# Patient Record
Sex: Female | Born: 1935 | Race: Black or African American | Hispanic: No
Health system: Southern US, Community
[De-identification: ages and names within clinical notes are randomized; demographics above are authoritative.]

---

## 2004-09-10 ENCOUNTER — Inpatient Hospital Stay: Payer: Self-pay | Admitting: Unknown Physician Specialty

## 2005-12-06 ENCOUNTER — Ambulatory Visit: Payer: Self-pay | Admitting: Unknown Physician Specialty

## 2006-02-24 ENCOUNTER — Inpatient Hospital Stay: Payer: Self-pay | Admitting: Unknown Physician Specialty

## 2006-07-04 ENCOUNTER — Inpatient Hospital Stay: Payer: Self-pay

## 2006-07-04 ENCOUNTER — Other Ambulatory Visit: Payer: Self-pay

## 2006-12-20 ENCOUNTER — Other Ambulatory Visit: Payer: Self-pay

## 2006-12-20 ENCOUNTER — Inpatient Hospital Stay: Payer: Self-pay | Admitting: Internal Medicine

## 2007-01-11 ENCOUNTER — Ambulatory Visit: Payer: Self-pay | Admitting: Oncology

## 2007-01-27 ENCOUNTER — Ambulatory Visit: Payer: Self-pay | Admitting: Oncology

## 2007-02-10 ENCOUNTER — Ambulatory Visit: Payer: Self-pay | Admitting: Oncology

## 2007-02-12 ENCOUNTER — Ambulatory Visit: Payer: Self-pay | Admitting: Oncology

## 2007-03-04 ENCOUNTER — Ambulatory Visit: Payer: Self-pay | Admitting: Unknown Physician Specialty

## 2007-03-05 ENCOUNTER — Other Ambulatory Visit: Payer: Self-pay

## 2007-03-05 ENCOUNTER — Inpatient Hospital Stay: Payer: Self-pay | Admitting: *Deleted

## 2007-03-13 ENCOUNTER — Ambulatory Visit: Payer: Self-pay | Admitting: Oncology

## 2007-04-12 ENCOUNTER — Ambulatory Visit: Payer: Self-pay | Admitting: Oncology

## 2007-04-12 ENCOUNTER — Other Ambulatory Visit: Payer: Self-pay

## 2007-04-12 ENCOUNTER — Emergency Department: Payer: Self-pay | Admitting: Emergency Medicine

## 2007-05-13 ENCOUNTER — Ambulatory Visit: Payer: Self-pay | Admitting: Oncology

## 2007-06-10 ENCOUNTER — Ambulatory Visit: Payer: Self-pay | Admitting: Oncology

## 2007-06-13 ENCOUNTER — Ambulatory Visit: Payer: Self-pay | Admitting: Oncology

## 2007-06-25 ENCOUNTER — Emergency Department: Payer: Self-pay | Admitting: Emergency Medicine

## 2007-07-11 ENCOUNTER — Ambulatory Visit: Payer: Self-pay | Admitting: Oncology

## 2007-08-11 ENCOUNTER — Ambulatory Visit: Payer: Self-pay | Admitting: Oncology

## 2007-08-12 ENCOUNTER — Ambulatory Visit: Payer: Self-pay | Admitting: Oncology

## 2007-09-10 ENCOUNTER — Ambulatory Visit: Payer: Self-pay | Admitting: Oncology

## 2007-09-11 ENCOUNTER — Other Ambulatory Visit: Payer: Self-pay

## 2007-09-12 ENCOUNTER — Inpatient Hospital Stay: Payer: Self-pay | Admitting: Internal Medicine

## 2007-09-14 ENCOUNTER — Ambulatory Visit: Payer: Self-pay | Admitting: Oncology

## 2007-10-11 ENCOUNTER — Ambulatory Visit: Payer: Self-pay | Admitting: Oncology

## 2008-05-12 ENCOUNTER — Ambulatory Visit: Payer: Self-pay | Admitting: Oncology

## 2008-05-17 ENCOUNTER — Ambulatory Visit: Payer: Self-pay | Admitting: Oncology

## 2008-06-12 ENCOUNTER — Ambulatory Visit: Payer: Self-pay | Admitting: Oncology

## 2008-07-10 ENCOUNTER — Ambulatory Visit: Payer: Self-pay | Admitting: Oncology

## 2008-07-13 ENCOUNTER — Ambulatory Visit: Payer: Self-pay | Admitting: Oncology

## 2008-08-10 ENCOUNTER — Ambulatory Visit: Payer: Self-pay | Admitting: Oncology

## 2008-09-09 ENCOUNTER — Ambulatory Visit: Payer: Self-pay | Admitting: Oncology

## 2008-11-09 ENCOUNTER — Ambulatory Visit: Payer: Self-pay | Admitting: Oncology

## 2008-11-28 ENCOUNTER — Ambulatory Visit: Payer: Self-pay | Admitting: Oncology

## 2008-12-10 ENCOUNTER — Ambulatory Visit: Payer: Self-pay | Admitting: Oncology

## 2009-01-10 ENCOUNTER — Ambulatory Visit: Payer: Self-pay | Admitting: Oncology

## 2009-01-11 ENCOUNTER — Ambulatory Visit: Payer: Self-pay | Admitting: Oncology

## 2009-01-31 ENCOUNTER — Emergency Department: Payer: Self-pay | Admitting: Emergency Medicine

## 2009-02-09 ENCOUNTER — Ambulatory Visit: Payer: Self-pay | Admitting: Oncology

## 2009-03-12 ENCOUNTER — Ambulatory Visit: Payer: Self-pay | Admitting: Oncology

## 2009-03-24 ENCOUNTER — Emergency Department: Payer: Self-pay | Admitting: Internal Medicine

## 2009-05-12 ENCOUNTER — Ambulatory Visit: Payer: Self-pay | Admitting: Oncology

## 2009-06-06 ENCOUNTER — Ambulatory Visit: Payer: Self-pay | Admitting: Oncology

## 2009-06-12 ENCOUNTER — Ambulatory Visit: Payer: Self-pay | Admitting: Oncology

## 2009-06-13 ENCOUNTER — Ambulatory Visit: Payer: Self-pay | Admitting: Oncology

## 2009-07-06 ENCOUNTER — Emergency Department: Payer: Self-pay | Admitting: Emergency Medicine

## 2009-07-10 ENCOUNTER — Ambulatory Visit: Payer: Self-pay | Admitting: Oncology

## 2009-08-10 ENCOUNTER — Ambulatory Visit: Payer: Self-pay | Admitting: Oncology

## 2009-09-09 ENCOUNTER — Ambulatory Visit: Payer: Self-pay | Admitting: Oncology

## 2009-10-10 ENCOUNTER — Ambulatory Visit: Payer: Self-pay | Admitting: Oncology

## 2009-11-09 ENCOUNTER — Ambulatory Visit: Payer: Self-pay | Admitting: Oncology

## 2009-12-10 ENCOUNTER — Ambulatory Visit: Payer: Self-pay | Admitting: Oncology

## 2010-02-09 ENCOUNTER — Ambulatory Visit: Payer: Self-pay | Admitting: Oncology

## 2010-02-11 ENCOUNTER — Ambulatory Visit: Payer: Self-pay | Admitting: Oncology

## 2010-03-12 ENCOUNTER — Ambulatory Visit: Payer: Self-pay | Admitting: Oncology

## 2010-04-11 ENCOUNTER — Ambulatory Visit: Payer: Self-pay | Admitting: Oncology

## 2010-05-12 ENCOUNTER — Ambulatory Visit: Payer: Self-pay | Admitting: Oncology

## 2010-05-28 ENCOUNTER — Ambulatory Visit: Payer: Self-pay | Admitting: Oncology

## 2010-06-12 ENCOUNTER — Ambulatory Visit: Payer: Self-pay | Admitting: Oncology

## 2010-08-11 ENCOUNTER — Ambulatory Visit: Payer: Self-pay | Admitting: Oncology

## 2010-08-27 ENCOUNTER — Ambulatory Visit: Payer: Self-pay | Admitting: Oncology

## 2010-09-10 ENCOUNTER — Ambulatory Visit: Payer: Self-pay | Admitting: Oncology

## 2010-09-17 ENCOUNTER — Ambulatory Visit: Payer: Self-pay | Admitting: Oncology

## 2010-10-11 ENCOUNTER — Ambulatory Visit: Payer: Self-pay | Admitting: Oncology

## 2010-11-10 ENCOUNTER — Ambulatory Visit: Payer: Self-pay | Admitting: Oncology

## 2010-11-19 ENCOUNTER — Ambulatory Visit: Payer: Self-pay | Admitting: Oncology

## 2010-11-29 ENCOUNTER — Inpatient Hospital Stay: Payer: Self-pay | Admitting: Orthopedic Surgery

## 2010-12-11 ENCOUNTER — Ambulatory Visit: Payer: Self-pay | Admitting: Oncology

## 2010-12-26 ENCOUNTER — Emergency Department: Payer: Self-pay | Admitting: Emergency Medicine

## 2011-01-11 ENCOUNTER — Ambulatory Visit: Payer: Self-pay | Admitting: Oncology

## 2011-02-10 ENCOUNTER — Ambulatory Visit: Payer: Self-pay | Admitting: Oncology

## 2011-02-14 ENCOUNTER — Emergency Department: Payer: Self-pay | Admitting: Internal Medicine

## 2011-02-24 ENCOUNTER — Ambulatory Visit: Payer: Self-pay | Admitting: Oncology

## 2011-03-13 ENCOUNTER — Ambulatory Visit: Payer: Self-pay | Admitting: Oncology

## 2011-04-12 ENCOUNTER — Ambulatory Visit: Payer: Self-pay | Admitting: Oncology

## 2011-05-13 ENCOUNTER — Ambulatory Visit: Payer: Self-pay | Admitting: Oncology

## 2011-05-31 ENCOUNTER — Inpatient Hospital Stay: Payer: Self-pay | Admitting: Internal Medicine

## 2011-05-31 LAB — COMPREHENSIVE METABOLIC PANEL
Albumin: 2.2 g/dL — ABNORMAL LOW (ref 3.4–5.0)
Alkaline Phosphatase: 84 U/L (ref 50–136)
Anion Gap: 8 (ref 7–16)
BUN: 15 mg/dL (ref 7–18)
Bilirubin,Total: 0.2 mg/dL (ref 0.2–1.0)
Calcium, Total: 8.5 mg/dL (ref 8.5–10.1)
Creatinine: 0.61 mg/dL (ref 0.60–1.30)
Glucose: 107 mg/dL — ABNORMAL HIGH (ref 65–99)
Osmolality: 290 (ref 275–301)
Potassium: 4.1 mmol/L (ref 3.5–5.1)
SGPT (ALT): 20 U/L
Sodium: 145 mmol/L (ref 136–145)
Total Protein: 6.4 g/dL (ref 6.4–8.2)

## 2011-05-31 LAB — CBC
MCH: 29.3 pg (ref 26.0–34.0)
MCHC: 32.1 g/dL (ref 32.0–36.0)
RDW: 17 % — ABNORMAL HIGH (ref 11.5–14.5)

## 2011-05-31 LAB — PROTIME-INR
INR: 1
Prothrombin Time: 14 secs (ref 11.5–14.7)

## 2011-05-31 LAB — TROPONIN I: Troponin-I: <0.02 ng/mL

## 2011-06-01 LAB — CBC WITH DIFFERENTIAL/PLATELET
Basophil %: 0.1 %
Eosinophil #: 0.1 10*3/uL (ref 0.0–0.7)
Eosinophil %: 1 %
HGB: 8.5 g/dL — ABNORMAL LOW (ref 12.0–16.0)
Lymphocyte %: 10.7 %
MCHC: 32.7 g/dL (ref 32.0–36.0)
Monocyte %: 6.1 %
Neutrophil #: 4.7 10*3/uL (ref 1.4–6.5)

## 2011-06-01 LAB — BASIC METABOLIC PANEL
BUN: 9 mg/dL (ref 7–18)
Calcium, Total: 8.2 mg/dL — ABNORMAL LOW (ref 8.5–10.1)
Co2: 28 mmol/L (ref 21–32)
Creatinine: 0.57 mg/dL — ABNORMAL LOW (ref 0.60–1.30)
Glucose: 77 mg/dL (ref 65–99)
Osmolality: 279 (ref 275–301)
Sodium: 141 mmol/L (ref 136–145)

## 2011-06-02 LAB — CBC WITH DIFFERENTIAL/PLATELET
Basophil %: 0.5 %
Eosinophil #: 0 10*3/uL (ref 0.0–0.7)
Eosinophil %: 0.6 %
HCT: 25 % — ABNORMAL LOW (ref 35.0–47.0)
HGB: 8.2 g/dL — ABNORMAL LOW (ref 12.0–16.0)
Lymphocyte #: 0.8 10*3/uL — ABNORMAL LOW (ref 1.0–3.6)
MCH: 29.7 pg (ref 26.0–34.0)
MCV: 90 fL (ref 80–100)
Monocyte #: 0.6 10*3/uL (ref 0.0–0.7)
Monocyte %: 9.1 %
RBC: 2.77 10*6/uL — ABNORMAL LOW (ref 3.80–5.20)
WBC: 7.1 10*3/uL (ref 3.6–11.0)

## 2011-06-03 LAB — CBC WITH DIFFERENTIAL/PLATELET
Basophil %: 0.3 %
Eosinophil #: 0.1 10*3/uL (ref 0.0–0.7)
HCT: 26.4 % — ABNORMAL LOW (ref 35.0–47.0)
HGB: 8.4 g/dL — ABNORMAL LOW (ref 12.0–16.0)
Lymphocyte %: 11.2 %
MCH: 29.3 pg (ref 26.0–34.0)
MCHC: 31.9 g/dL — ABNORMAL LOW (ref 32.0–36.0)
MCV: 92 fL (ref 80–100)
Monocyte #: 0.6 10*3/uL (ref 0.0–0.7)
Monocyte %: 8.1 %
Neutrophil #: 6 10*3/uL (ref 1.4–6.5)

## 2011-06-03 LAB — BASIC METABOLIC PANEL
BUN: 10 mg/dL (ref 7–18)
Chloride: 101 mmol/L (ref 98–107)
Co2: 35 mmol/L — ABNORMAL HIGH (ref 21–32)
Creatinine: 0.65 mg/dL (ref 0.60–1.30)
Potassium: 3.5 mmol/L (ref 3.5–5.1)

## 2011-06-13 ENCOUNTER — Ambulatory Visit: Payer: Self-pay | Admitting: Oncology

## 2011-06-16 ENCOUNTER — Inpatient Hospital Stay: Payer: Self-pay | Admitting: Internal Medicine

## 2011-06-16 LAB — URINALYSIS, COMPLETE
Bilirubin,UR: NEGATIVE
Blood: NEGATIVE
Hyaline Cast: 4
Leukocyte Esterase: NEGATIVE
Nitrite: NEGATIVE
Ph: 5 (ref 4.5–8.0)
Protein: NEGATIVE
RBC,UR: <1 /HPF (ref 0–5)
Specific Gravity: 1.01 (ref 1.003–1.030)

## 2011-06-16 LAB — COMPREHENSIVE METABOLIC PANEL
Albumin: 2.1 g/dL — ABNORMAL LOW (ref 3.4–5.0)
Anion Gap: 10 (ref 7–16)
BUN: 16 mg/dL (ref 7–18)
Bilirubin,Total: 0.2 mg/dL (ref 0.2–1.0)
Chloride: 103 mmol/L (ref 98–107)
Co2: 29 mmol/L (ref 21–32)
Creatinine: 0.85 mg/dL (ref 0.60–1.30)
EGFR (African American): >60
EGFR (Non-African Amer.): >60
Glucose: 131 mg/dL — ABNORMAL HIGH (ref 65–99)
Osmolality: 286 (ref 275–301)
Potassium: 4.2 mmol/L (ref 3.5–5.1)
SGPT (ALT): 35 U/L
Total Protein: 6.8 g/dL (ref 6.4–8.2)

## 2011-06-16 LAB — IRON AND TIBC
Iron Bind.Cap.(Total): 220 ug/dL — ABNORMAL LOW (ref 250–450)
Iron Saturation: 13 %
Unbound Iron-Bind.Cap.: 191 ug/dL

## 2011-06-16 LAB — CBC
HCT: 20.6 % — ABNORMAL LOW (ref 35.0–47.0)
HGB: 6.6 g/dL — ABNORMAL LOW (ref 12.0–16.0)
MCH: 28.5 pg (ref 26.0–34.0)
MCHC: 31.9 g/dL — ABNORMAL LOW (ref 32.0–36.0)
MCV: 89 fL (ref 80–100)
Platelet: 369 10*3/uL (ref 150–440)
WBC: 9.6 10*3/uL (ref 3.6–11.0)

## 2011-06-16 LAB — HEMOGLOBIN: HGB: 7.1 g/dL — ABNORMAL LOW (ref 12.0–16.0)

## 2011-06-16 LAB — LIPASE, BLOOD: Lipase: 69 U/L — ABNORMAL LOW (ref 73–393)

## 2011-06-16 LAB — PROTIME-INR
INR: 1.1
Prothrombin Time: 14.7 secs (ref 11.5–14.7)

## 2011-06-16 LAB — FOLATE: Folic Acid: 42.2 ng/mL (ref 3.1–100.0)

## 2011-06-16 LAB — TROPONIN I: Troponin-I: <0.02 ng/mL

## 2011-06-17 LAB — CBC WITH DIFFERENTIAL/PLATELET
Basophil #: 0 10*3/uL (ref 0.0–0.1)
Basophil %: 0.1 %
HCT: 24.3 % — ABNORMAL LOW (ref 35.0–47.0)
Lymphocyte #: 0.9 10*3/uL — ABNORMAL LOW (ref 1.0–3.6)
Lymphocyte %: 10.2 %
MCH: 27.9 pg (ref 26.0–34.0)
MCHC: 32.5 g/dL (ref 32.0–36.0)
MCV: 86 fL (ref 80–100)
Monocyte %: 7.7 %
Neutrophil #: 6.8 10*3/uL — ABNORMAL HIGH (ref 1.4–6.5)
RDW: 17.1 % — ABNORMAL HIGH (ref 11.5–14.5)
WBC: 8.4 10*3/uL (ref 3.6–11.0)

## 2011-06-17 LAB — BASIC METABOLIC PANEL
BUN: 13 mg/dL (ref 7–18)
Chloride: 106 mmol/L (ref 98–107)
Co2: 29 mmol/L (ref 21–32)
Creatinine: 0.67 mg/dL (ref 0.60–1.30)
Osmolality: 288 (ref 275–301)
Potassium: 4 mmol/L (ref 3.5–5.1)
Sodium: 145 mmol/L (ref 136–145)

## 2011-06-17 LAB — HEMOGLOBIN A1C: Hemoglobin A1C: 5.5 % (ref 4.2–6.3)

## 2011-06-17 LAB — TSH: Thyroid Stimulating Horm: 0.868 u[IU]/mL

## 2011-06-18 LAB — CBC WITH DIFFERENTIAL/PLATELET
Basophil #: 0 10*3/uL (ref 0.0–0.1)
Basophil %: 0 %
HCT: 29.8 % — ABNORMAL LOW (ref 35.0–47.0)
Lymphocyte #: 0.4 10*3/uL — ABNORMAL LOW (ref 1.0–3.6)
MCH: 28 pg (ref 26.0–34.0)
MCV: 86 fL (ref 80–100)
Monocyte #: 0.5 10*3/uL (ref 0.0–0.7)
Platelet: 331 10*3/uL (ref 150–440)
RDW: 16.5 % — ABNORMAL HIGH (ref 11.5–14.5)

## 2011-06-18 LAB — BASIC METABOLIC PANEL
Anion Gap: 10 (ref 7–16)
Calcium, Total: 8.2 mg/dL — ABNORMAL LOW (ref 8.5–10.1)
Creatinine: 0.52 mg/dL — ABNORMAL LOW (ref 0.60–1.30)
EGFR (African American): >60
EGFR (Non-African Amer.): >60
Glucose: 162 mg/dL — ABNORMAL HIGH (ref 65–99)
Sodium: 143 mmol/L (ref 136–145)

## 2011-06-18 LAB — PRO B NATRIURETIC PEPTIDE: B-Type Natriuretic Peptide: 2888 pg/mL — ABNORMAL HIGH (ref 0–450)

## 2011-06-19 LAB — CBC WITH DIFFERENTIAL/PLATELET
Basophil %: 0 %
Eosinophil #: 0 10*3/uL (ref 0.0–0.7)
Eosinophil %: 0 %
HGB: 10.4 g/dL — ABNORMAL LOW (ref 12.0–16.0)
Lymphocyte #: 0.3 10*3/uL — ABNORMAL LOW (ref 1.0–3.6)
MCH: 28.1 pg (ref 26.0–34.0)
MCHC: 32.6 g/dL (ref 32.0–36.0)
MCV: 86 fL (ref 80–100)
Monocyte #: 0.1 10*3/uL (ref 0.0–0.7)
Neutrophil %: 95.5 %
RBC: 3.7 10*6/uL — ABNORMAL LOW (ref 3.80–5.20)
RDW: 16.7 % — ABNORMAL HIGH (ref 11.5–14.5)

## 2011-07-10 ENCOUNTER — Emergency Department: Payer: Self-pay | Admitting: Internal Medicine

## 2011-07-10 LAB — COMPREHENSIVE METABOLIC PANEL
Albumin: 2.8 g/dL — ABNORMAL LOW (ref 3.4–5.0)
Alkaline Phosphatase: 117 U/L (ref 50–136)
BUN: 13 mg/dL (ref 7–18)
Bilirubin,Total: 0.3 mg/dL (ref 0.2–1.0)
Calcium, Total: 8.9 mg/dL (ref 8.5–10.1)
Creatinine: 0.81 mg/dL (ref 0.60–1.30)
EGFR (African American): >60
Glucose: 118 mg/dL — ABNORMAL HIGH (ref 65–99)
SGOT(AST): 25 U/L (ref 15–37)
SGPT (ALT): 40 U/L
Total Protein: 7.5 g/dL (ref 6.4–8.2)

## 2011-07-10 LAB — CBC
HGB: 8.8 g/dL — ABNORMAL LOW (ref 12.0–16.0)
MCV: 88 fL (ref 80–100)
Platelet: 334 10*3/uL (ref 150–440)
RBC: 3.15 10*6/uL — ABNORMAL LOW (ref 3.80–5.20)
WBC: 9.5 10*3/uL (ref 3.6–11.0)

## 2011-07-11 ENCOUNTER — Ambulatory Visit: Payer: Self-pay | Admitting: Oncology

## 2011-08-05 ENCOUNTER — Emergency Department: Payer: Self-pay | Admitting: Emergency Medicine

## 2011-08-05 LAB — COMPREHENSIVE METABOLIC PANEL
Alkaline Phosphatase: 111 U/L (ref 50–136)
Anion Gap: 9 (ref 7–16)
BUN: 15 mg/dL (ref 7–18)
Calcium, Total: 8.6 mg/dL (ref 8.5–10.1)
Co2: 31 mmol/L (ref 21–32)
Creatinine: 0.55 mg/dL — ABNORMAL LOW (ref 0.60–1.30)
EGFR (Non-African Amer.): >60
Osmolality: 288 (ref 275–301)
Potassium: 4.1 mmol/L (ref 3.5–5.1)
SGOT(AST): 28 U/L (ref 15–37)
Sodium: 144 mmol/L (ref 136–145)
Total Protein: 7.1 g/dL (ref 6.4–8.2)

## 2011-08-05 LAB — PROTIME-INR
INR: 1
Prothrombin Time: 13.8 secs (ref 11.5–14.7)

## 2011-08-05 LAB — CBC
MCHC: 32 g/dL (ref 32.0–36.0)
MCV: 85 fL (ref 80–100)
Platelet: 335 10*3/uL (ref 150–440)
RDW: 18.6 % — ABNORMAL HIGH (ref 11.5–14.5)

## 2011-08-05 LAB — PRO B NATRIURETIC PEPTIDE: B-Type Natriuretic Peptide: 2022 pg/mL — ABNORMAL HIGH (ref 0–450)

## 2011-08-05 LAB — CK TOTAL AND CKMB (NOT AT ARMC)
CK, Total: 31 U/L (ref 21–215)
CK-MB: 1.2 ng/mL (ref 0.5–3.6)

## 2011-08-08 LAB — COMPREHENSIVE METABOLIC PANEL
Albumin: 2.8 g/dL — ABNORMAL LOW (ref 3.4–5.0)
Alkaline Phosphatase: 120 U/L (ref 50–136)
Anion Gap: 7 (ref 7–16)
BUN: 19 mg/dL — ABNORMAL HIGH (ref 7–18)
Bilirubin,Total: 0.3 mg/dL (ref 0.2–1.0)
Calcium, Total: 8.8 mg/dL (ref 8.5–10.1)
Chloride: 102 mmol/L (ref 98–107)
Creatinine: 0.78 mg/dL (ref 0.60–1.30)
EGFR (African American): >60
EGFR (Non-African Amer.): >60
Osmolality: 291 (ref 275–301)
Potassium: 4 mmol/L (ref 3.5–5.1)
SGOT(AST): 17 U/L (ref 15–37)
Sodium: 143 mmol/L (ref 136–145)
Total Protein: 7.3 g/dL (ref 6.4–8.2)

## 2011-08-08 LAB — CBC
HCT: 25.9 % — ABNORMAL LOW (ref 35.0–47.0)
MCHC: 31.8 g/dL — ABNORMAL LOW (ref 32.0–36.0)

## 2011-08-08 LAB — LIPASE, BLOOD: Lipase: 92 U/L (ref 73–393)

## 2011-08-09 ENCOUNTER — Inpatient Hospital Stay: Payer: Self-pay | Admitting: Internal Medicine

## 2011-08-09 LAB — CK TOTAL AND CKMB (NOT AT ARMC)
CK, Total: 29 U/L (ref 21–215)
CK-MB: 1.5 ng/mL (ref 0.5–3.6)
CK-MB: 1.3 ng/mL (ref 0.5–3.6)

## 2011-08-09 LAB — URINALYSIS, COMPLETE
Bacteria: NONE SEEN
Bilirubin,UR: NEGATIVE
Blood: NEGATIVE
Ketone: NEGATIVE
Leukocyte Esterase: NEGATIVE
Ph: 7 (ref 4.5–8.0)
Protein: NEGATIVE
Specific Gravity: 1.015 (ref 1.003–1.030)
Squamous Epithelial: 1
WBC UR: 1 /HPF (ref 0–5)

## 2011-08-09 LAB — TROPONIN I
Troponin-I: <0.02 ng/mL
Troponin-I: 0.04 ng/mL

## 2011-08-09 LAB — HEMOGLOBIN
HGB: 7.2 g/dL — ABNORMAL LOW (ref 12.0–16.0)
HGB: 7.1 g/dL — ABNORMAL LOW (ref 12.0–16.0)
HGB: 7.6 g/dL — ABNORMAL LOW (ref 12.0–16.0)
HGB: 8.4 g/dL — ABNORMAL LOW (ref 12.0–16.0)

## 2011-08-10 LAB — CBC WITH DIFFERENTIAL/PLATELET
Basophil #: 0 10*3/uL (ref 0.0–0.1)
Eosinophil %: 0.7 %
HCT: 25.3 % — ABNORMAL LOW (ref 35.0–47.0)
HGB: 8.1 g/dL — ABNORMAL LOW (ref 12.0–16.0)
Lymphocyte %: 10.3 %
MCH: 28.1 pg (ref 26.0–34.0)
MCV: 88 fL (ref 80–100)
Neutrophil #: 8 10*3/uL — ABNORMAL HIGH (ref 1.4–6.5)
Neutrophil %: 81.8 %
Platelet: 291 10*3/uL (ref 150–440)
RBC: 2.87 10*6/uL — ABNORMAL LOW (ref 3.80–5.20)
WBC: 9.8 10*3/uL (ref 3.6–11.0)

## 2011-08-10 LAB — MAGNESIUM: Magnesium: 1.4 mg/dL — ABNORMAL LOW

## 2011-08-11 LAB — CBC WITH DIFFERENTIAL/PLATELET
Basophil #: 0 10*3/uL (ref 0.0–0.1)
Eosinophil %: 0.6 %
Lymphocyte #: 1 10*3/uL (ref 1.0–3.6)
MCH: 28.7 pg (ref 26.0–34.0)
MCHC: 32.4 g/dL (ref 32.0–36.0)
Monocyte #: 0.7 10*3/uL (ref 0.0–0.7)
Monocyte %: 7.1 %
Neutrophil #: 8.4 10*3/uL — ABNORMAL HIGH (ref 1.4–6.5)
Platelet: 309 10*3/uL (ref 150–440)
RBC: 3.43 10*6/uL — ABNORMAL LOW (ref 3.80–5.20)
WBC: 10.1 10*3/uL (ref 3.6–11.0)

## 2011-08-12 LAB — CBC WITH DIFFERENTIAL/PLATELET
Basophil #: 0 10*3/uL (ref 0.0–0.1)
Eosinophil #: 0 10*3/uL (ref 0.0–0.7)
Eosinophil %: 0.5 %
HCT: 32.1 % — ABNORMAL LOW (ref 35.0–47.0)
Lymphocyte #: 0.8 10*3/uL — ABNORMAL LOW (ref 1.0–3.6)
MCV: 89 fL (ref 80–100)
Monocyte #: 0.6 10*3/uL (ref 0.0–0.7)
Monocyte %: 7.5 %
Neutrophil %: 81.6 %
RDW: 18 % — ABNORMAL HIGH (ref 11.5–14.5)
WBC: 8.3 10*3/uL (ref 3.6–11.0)

## 2011-08-14 LAB — CULTURE, BLOOD (SINGLE)

## 2011-09-05 ENCOUNTER — Ambulatory Visit: Payer: Self-pay | Admitting: Family Medicine

## 2011-09-10 ENCOUNTER — Ambulatory Visit: Payer: Self-pay | Admitting: Oncology

## 2011-10-02 ENCOUNTER — Inpatient Hospital Stay: Payer: Self-pay | Admitting: Internal Medicine

## 2011-10-02 LAB — IRON AND TIBC
Iron: 68 ug/dL (ref 50–170)
Unbound Iron-Bind.Cap.: 169 ug/dL

## 2011-10-02 LAB — URINALYSIS, COMPLETE
Hyaline Cast: 1
Ketone: NEGATIVE
Nitrite: POSITIVE
Ph: 6 (ref 4.5–8.0)
Specific Gravity: 1.015 (ref 1.003–1.030)
WBC UR: 31 /HPF (ref 0–5)

## 2011-10-02 LAB — CBC
HGB: 8.7 g/dL — ABNORMAL LOW (ref 12.0–16.0)
MCHC: 30.2 g/dL — ABNORMAL LOW (ref 32.0–36.0)
Platelet: 216 10*3/uL (ref 150–440)
RBC: 3.17 10*6/uL — ABNORMAL LOW (ref 3.80–5.20)

## 2011-10-02 LAB — COMPREHENSIVE METABOLIC PANEL
Albumin: 2.7 g/dL — ABNORMAL LOW (ref 3.4–5.0)
Alkaline Phosphatase: 128 U/L (ref 50–136)
Anion Gap: 7 (ref 7–16)
BUN: 24 mg/dL — ABNORMAL HIGH (ref 7–18)
Bilirubin,Total: 0.3 mg/dL (ref 0.2–1.0)
Calcium, Total: 8.5 mg/dL (ref 8.5–10.1)
Co2: 32 mmol/L (ref 21–32)
Glucose: 162 mg/dL — ABNORMAL HIGH (ref 65–99)
Osmolality: 291 (ref 275–301)
SGOT(AST): 42 U/L — ABNORMAL HIGH (ref 15–37)
SGPT (ALT): 58 U/L
Sodium: 142 mmol/L (ref 136–145)
Total Protein: 7.3 g/dL (ref 6.4–8.2)

## 2011-10-02 LAB — LIPASE, BLOOD: Lipase: 91 U/L (ref 73–393)

## 2011-10-02 LAB — FERRITIN: Ferritin (ARMC): 102 ng/mL (ref 8–388)

## 2011-10-02 LAB — CK TOTAL AND CKMB (NOT AT ARMC)
CK, Total: 47 U/L (ref 21–215)
CK-MB: 1.7 ng/mL (ref 0.5–3.6)

## 2011-10-03 LAB — CBC WITH DIFFERENTIAL/PLATELET
Basophil #: 0 10*3/uL (ref 0.0–0.1)
Basophil %: 0.3 %
Eosinophil #: 0.1 10*3/uL (ref 0.0–0.7)
Eosinophil %: 1.5 %
HCT: 24.3 % — ABNORMAL LOW (ref 35.0–47.0)
Lymphocyte #: 0.9 10*3/uL — ABNORMAL LOW (ref 1.0–3.6)
Lymphocyte %: 12.2 %
MCH: 28.1 pg (ref 26.0–34.0)
Monocyte #: 0.6 x10 3/mm (ref 0.2–0.9)
Monocyte %: 8.2 %
Neutrophil #: 5.4 10*3/uL (ref 1.4–6.5)
Neutrophil %: 77.8 %
RDW: 18.1 % — ABNORMAL HIGH (ref 11.5–14.5)
WBC: 7 10*3/uL (ref 3.6–11.0)

## 2011-10-03 LAB — BASIC METABOLIC PANEL
BUN: 20 mg/dL — ABNORMAL HIGH (ref 7–18)
Calcium, Total: 8.1 mg/dL — ABNORMAL LOW (ref 8.5–10.1)
Chloride: 103 mmol/L (ref 98–107)
Glucose: 74 mg/dL (ref 65–99)
Osmolality: 290 (ref 275–301)
Potassium: 3.8 mmol/L (ref 3.5–5.1)

## 2011-10-03 LAB — CK TOTAL AND CKMB (NOT AT ARMC)
CK, Total: 37 U/L (ref 21–215)
CK, Total: 34 U/L (ref 21–215)
CK-MB: 1.4 ng/mL (ref 0.5–3.6)
CK-MB: 1.4 ng/mL (ref 0.5–3.6)

## 2011-10-03 LAB — HEMOGLOBIN: HGB: 7.6 g/dL — ABNORMAL LOW (ref 12.0–16.0)

## 2011-10-03 LAB — TROPONIN I
Troponin-I: 0.03 ng/mL
Troponin-I: 0.02 ng/mL

## 2011-10-03 LAB — LIPID PANEL
HDL Cholesterol: 67 mg/dL — ABNORMAL HIGH (ref 40–60)
Ldl Cholesterol, Calc: 51 mg/dL (ref 0–100)

## 2011-10-04 LAB — HEMATOCRIT: HCT: 27.7 % — ABNORMAL LOW (ref 35.0–47.0)

## 2011-10-05 LAB — CBC WITH DIFFERENTIAL/PLATELET
Basophil #: 0.1 10*3/uL (ref 0.0–0.1)
Basophil %: 0.7 %
Eosinophil #: 0.1 10*3/uL (ref 0.0–0.7)
Eosinophil %: 0.8 %
HCT: 26.2 % — ABNORMAL LOW (ref 35.0–47.0)
Lymphocyte #: 1.1 10*3/uL (ref 1.0–3.6)
Lymphocyte %: 12.8 %
MCH: 28.5 pg (ref 26.0–34.0)
MCHC: 32 g/dL (ref 32.0–36.0)
MCV: 89 fL (ref 80–100)
Monocyte #: 0.5 x10 3/mm (ref 0.2–0.9)
Neutrophil #: 6.8 10*3/uL — ABNORMAL HIGH (ref 1.4–6.5)
RDW: 17.6 % — ABNORMAL HIGH (ref 11.5–14.5)
WBC: 8.5 10*3/uL (ref 3.6–11.0)

## 2011-10-05 LAB — FERRITIN: Ferritin (ARMC): 127 ng/mL (ref 8–388)

## 2011-10-05 LAB — IRON AND TIBC
Iron Bind.Cap.(Total): 153 ug/dL — ABNORMAL LOW (ref 250–450)
Iron Saturation: 18 %
Unbound Iron-Bind.Cap.: 126 ug/dL

## 2011-10-05 LAB — URINE CULTURE

## 2011-10-05 LAB — OCCULT BLOOD X 1 CARD TO LAB, STOOL: Occult Blood, Feces: POSITIVE

## 2011-10-05 LAB — MAGNESIUM: Magnesium: 1.5 mg/dL — ABNORMAL LOW

## 2011-10-06 LAB — BASIC METABOLIC PANEL
Anion Gap: 6 — ABNORMAL LOW (ref 7–16)
BUN: 10 mg/dL (ref 7–18)
Calcium, Total: 8.1 mg/dL — ABNORMAL LOW (ref 8.5–10.1)
Chloride: 107 mmol/L (ref 98–107)
Co2: 29 mmol/L (ref 21–32)
Creatinine: 0.5 mg/dL — ABNORMAL LOW (ref 0.60–1.30)
EGFR (Non-African Amer.): >60
Potassium: 3.8 mmol/L (ref 3.5–5.1)
Sodium: 142 mmol/L (ref 136–145)

## 2011-10-06 LAB — CBC WITH DIFFERENTIAL/PLATELET
Basophil #: 0 10*3/uL (ref 0.0–0.1)
Eosinophil %: 0.7 %
HCT: 26.4 % — ABNORMAL LOW (ref 35.0–47.0)
MCV: 89 fL (ref 80–100)
Monocyte #: 0.5 x10 3/mm (ref 0.2–0.9)
Monocyte %: 6.7 %
Neutrophil %: 79 %
Platelet: 164 10*3/uL (ref 150–440)
RBC: 2.95 10*6/uL — ABNORMAL LOW (ref 3.80–5.20)
WBC: 7.6 10*3/uL (ref 3.6–11.0)

## 2011-10-07 LAB — CBC WITH DIFFERENTIAL/PLATELET
Basophil #: 0.5 10*3/uL — ABNORMAL HIGH (ref 0.0–0.1)
Basophil %: 5.6 %
Eosinophil #: 0 10*3/uL (ref 0.0–0.7)
Lymphocyte #: 0.6 10*3/uL — ABNORMAL LOW (ref 1.0–3.6)
Lymphocyte %: 7.2 %
MCV: 89 fL (ref 80–100)
Monocyte #: 0.5 x10 3/mm (ref 0.2–0.9)
Monocyte %: 6.3 %
Neutrophil %: 80.5 %
RBC: 3.04 10*6/uL — ABNORMAL LOW (ref 3.80–5.20)
WBC: 8.2 10*3/uL (ref 3.6–11.0)

## 2011-10-11 ENCOUNTER — Ambulatory Visit: Payer: Self-pay | Admitting: Oncology

## 2011-10-22 ENCOUNTER — Ambulatory Visit: Payer: Self-pay | Admitting: Oncology

## 2011-10-22 LAB — CBC CANCER CENTER
Basophil %: 0.6 %
Eosinophil %: 0.2 %
HCT: 29.1 % — ABNORMAL LOW (ref 35.0–47.0)
Lymphocyte %: 5.8 %
MCHC: 31.1 g/dL — ABNORMAL LOW (ref 32.0–36.0)
MCV: 92 fL (ref 80–100)
Monocyte #: 0.5 x10 3/mm (ref 0.2–0.9)
Monocyte %: 4.3 %
Neutrophil #: 10.3 x10 3/mm — ABNORMAL HIGH (ref 1.4–6.5)
Neutrophil %: 89.1 %
Platelet: 239 x10 3/mm (ref 150–440)
RBC: 3.18 10*6/uL — ABNORMAL LOW (ref 3.80–5.20)
RDW: 17.6 % — ABNORMAL HIGH (ref 11.5–14.5)
WBC: 11.6 x10 3/mm — ABNORMAL HIGH (ref 3.6–11.0)

## 2011-10-22 LAB — IRON AND TIBC
Iron Bind.Cap.(Total): 207 ug/dL — ABNORMAL LOW (ref 250–450)
Unbound Iron-Bind.Cap.: 164 ug/dL

## 2011-10-22 LAB — FERRITIN: Ferritin (ARMC): 67 ng/mL (ref 8–388)

## 2011-11-04 LAB — CBC CANCER CENTER
Basophil #: 0 x10 3/mm (ref 0.0–0.1)
Basophil %: 0.4 %
Eosinophil #: 0 x10 3/mm (ref 0.0–0.7)
Eosinophil %: 0.3 %
HGB: 8.4 g/dL — ABNORMAL LOW (ref 12.0–16.0)
Lymphocyte #: 0.8 x10 3/mm — ABNORMAL LOW (ref 1.0–3.6)
Lymphocyte %: 8.6 %
MCHC: 30.7 g/dL — ABNORMAL LOW (ref 32.0–36.0)
Monocyte %: 7.5 %
Neutrophil #: 8 x10 3/mm — ABNORMAL HIGH (ref 1.4–6.5)
Platelet: 241 x10 3/mm (ref 150–440)
RBC: 2.93 10*6/uL — ABNORMAL LOW (ref 3.80–5.20)

## 2011-11-10 ENCOUNTER — Ambulatory Visit: Payer: Self-pay | Admitting: Oncology

## 2011-11-13 ENCOUNTER — Other Ambulatory Visit: Payer: Self-pay | Admitting: Internal Medicine

## 2011-11-16 ENCOUNTER — Emergency Department: Payer: Self-pay | Admitting: Emergency Medicine

## 2011-11-16 LAB — URINALYSIS, COMPLETE
RBC,UR: 4 /HPF (ref 0–5)
Squamous Epithelial: 1

## 2011-11-16 LAB — COMPREHENSIVE METABOLIC PANEL
Albumin: 2.7 g/dL — ABNORMAL LOW (ref 3.4–5.0)
Alkaline Phosphatase: 127 U/L (ref 50–136)
Bilirubin,Total: 0.6 mg/dL (ref 0.2–1.0)
Calcium, Total: 8.5 mg/dL (ref 8.5–10.1)
Creatinine: 0.81 mg/dL (ref 0.60–1.30)
EGFR (Non-African Amer.): >60
Glucose: 134 mg/dL — ABNORMAL HIGH (ref 65–99)
Osmolality: 281 (ref 275–301)
SGPT (ALT): 25 U/L
Sodium: 139 mmol/L (ref 136–145)
Total Protein: 7.1 g/dL (ref 6.4–8.2)

## 2011-11-16 LAB — CBC
MCHC: 30.6 g/dL — ABNORMAL LOW (ref 32.0–36.0)
MCV: 97 fL (ref 80–100)
Platelet: 265 10*3/uL (ref 150–440)
RBC: 3.45 10*6/uL — ABNORMAL LOW (ref 3.80–5.20)
RDW: 16.7 % — ABNORMAL HIGH (ref 11.5–14.5)
WBC: 13.2 10*3/uL — ABNORMAL HIGH (ref 3.6–11.0)

## 2011-11-16 LAB — T4, FREE: Free Thyroxine: 1.33 ng/dL (ref 0.76–1.46)

## 2011-11-17 ENCOUNTER — Ambulatory Visit: Payer: Self-pay | Admitting: Oncology

## 2011-11-25 LAB — CBC CANCER CENTER
Basophil %: 0.4 %
Eosinophil #: 0.1 x10 3/mm (ref 0.0–0.7)
Eosinophil %: 0.8 %
HCT: 33.5 % — ABNORMAL LOW (ref 35.0–47.0)
Lymphocyte #: 0.9 x10 3/mm — ABNORMAL LOW (ref 1.0–3.6)
Lymphocyte %: 9.4 %
MCH: 29.3 pg (ref 26.0–34.0)
MCHC: 30.4 g/dL — ABNORMAL LOW (ref 32.0–36.0)
Neutrophil #: 8.2 x10 3/mm — ABNORMAL HIGH (ref 1.4–6.5)
Neutrophil %: 83.2 %
Platelet: 331 x10 3/mm (ref 150–440)
WBC: 9.8 x10 3/mm (ref 3.6–11.0)

## 2011-11-25 LAB — IRON AND TIBC
Iron Bind.Cap.(Total): 155 ug/dL — ABNORMAL LOW (ref 250–450)
Iron Saturation: 76 %

## 2011-11-25 LAB — FERRITIN: Ferritin (ARMC): 791 ng/mL — ABNORMAL HIGH (ref 8–388)

## 2011-12-11 ENCOUNTER — Ambulatory Visit: Payer: Self-pay | Admitting: Oncology

## 2011-12-16 LAB — CBC CANCER CENTER
Basophil #: 0.1 x10 3/mm (ref 0.0–0.1)
Eosinophil #: 0.1 x10 3/mm (ref 0.0–0.7)
Eosinophil %: 0.4 %
HGB: 10.3 g/dL — ABNORMAL LOW (ref 12.0–16.0)
Lymphocyte #: 0.6 x10 3/mm — ABNORMAL LOW (ref 1.0–3.6)
MCH: 30.2 pg (ref 26.0–34.0)
MCHC: 31.6 g/dL — ABNORMAL LOW (ref 32.0–36.0)
MCV: 96 fL (ref 80–100)
Monocyte #: 0.8 x10 3/mm (ref 0.2–0.9)
Neutrophil %: 89.8 %
Platelet: 253 x10 3/mm (ref 150–440)
RDW: 16.8 % — ABNORMAL HIGH (ref 11.5–14.5)
WBC: 14.9 x10 3/mm — ABNORMAL HIGH (ref 3.6–11.0)

## 2011-12-16 LAB — FERRITIN: Ferritin (ARMC): 974 ng/mL — ABNORMAL HIGH (ref 8–388)

## 2011-12-16 LAB — IRON AND TIBC
Iron Bind.Cap.(Total): 158 ug/dL — ABNORMAL LOW (ref 250–450)
Iron: 43 ug/dL — ABNORMAL LOW (ref 50–170)
Unbound Iron-Bind.Cap.: 115 ug/dL

## 2011-12-26 LAB — CANCER CENTER HEMOGLOBIN: HGB: 9.5 g/dL — ABNORMAL LOW (ref 12.0–16.0)

## 2012-01-06 LAB — COMPREHENSIVE METABOLIC PANEL
BUN: 12 mg/dL (ref 7–18)
Calcium, Total: 9 mg/dL (ref 8.5–10.1)
Chloride: 101 mmol/L (ref 98–107)
Co2: 37 mmol/L — ABNORMAL HIGH (ref 21–32)
EGFR (Non-African Amer.): >60
Glucose: 144 mg/dL — ABNORMAL HIGH (ref 65–99)
SGOT(AST): 22 U/L (ref 15–37)
SGPT (ALT): 34 U/L (ref 12–78)
Total Protein: 7.1 g/dL (ref 6.4–8.2)

## 2012-01-06 LAB — CBC CANCER CENTER
Basophil %: 0.5 %
Eosinophil #: 0 x10 3/mm (ref 0.0–0.7)
HCT: 33.1 % — ABNORMAL LOW (ref 35.0–47.0)
Lymphocyte #: 0.5 x10 3/mm — ABNORMAL LOW (ref 1.0–3.6)
MCH: 29.1 pg (ref 26.0–34.0)
MCHC: 30 g/dL — ABNORMAL LOW (ref 32.0–36.0)
Monocyte #: 0.2 x10 3/mm (ref 0.2–0.9)
Neutrophil #: 12.6 x10 3/mm — ABNORMAL HIGH (ref 1.4–6.5)
Neutrophil %: 94 %
Platelet: 278 x10 3/mm (ref 150–440)
RDW: 16.8 % — ABNORMAL HIGH (ref 11.5–14.5)
WBC: 13.4 x10 3/mm — ABNORMAL HIGH (ref 3.6–11.0)

## 2012-01-11 ENCOUNTER — Ambulatory Visit: Payer: Self-pay | Admitting: Oncology

## 2012-03-12 DEATH — deceased

## 2013-01-31 IMAGING — CT CT ABD-PELV W/ CM
1 of 2 series · 14 of 32 positions shown, 18 images · non-contrast
Comparison: none

REASON FOR EXAM: (1) abd pain; (2) abd pain
COMMENTS:

[Series 2: 3mm soft tissue · axial · 0.68mm/px · z∈[-402,-2]mm · 14 of 153 slices shown, 18 images]
[im 13/153  soft-tissue]
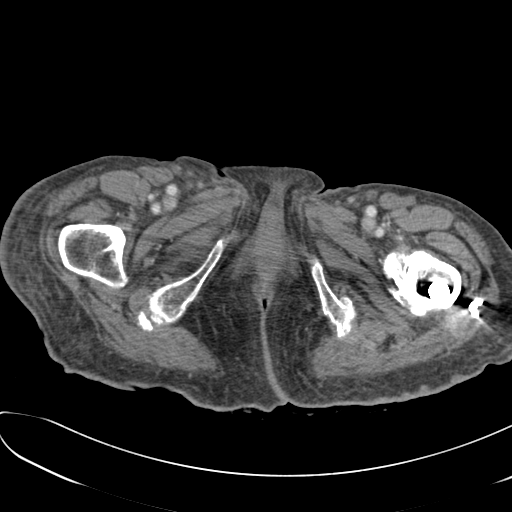
[im 13/153  bone]
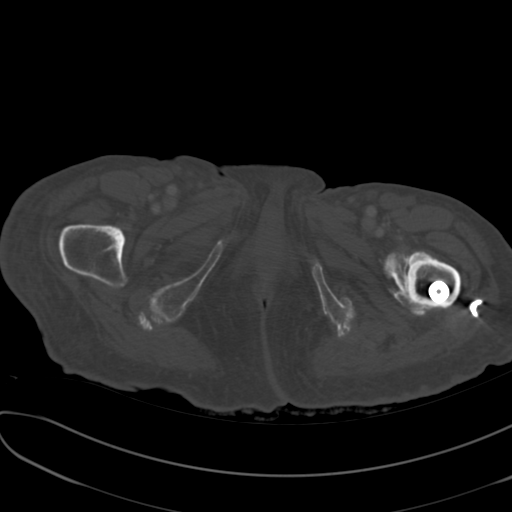
[im 25/153  soft-tissue]
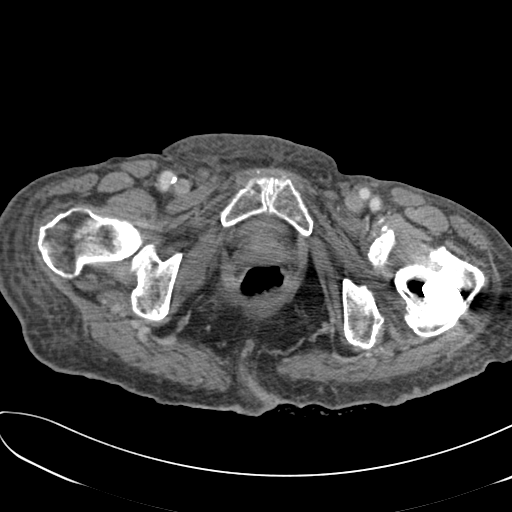
[im 37/153  soft-tissue]
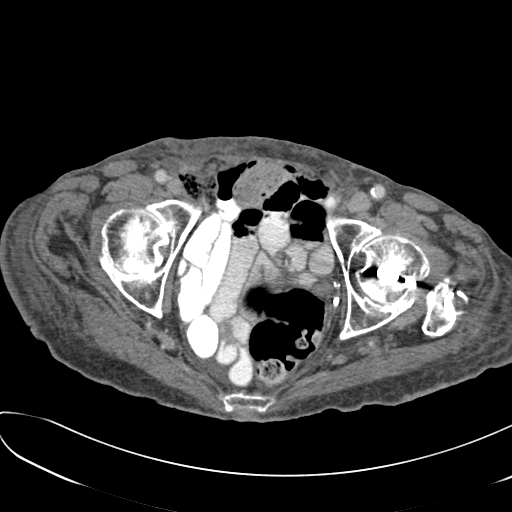
[im 49/153  soft-tissue]
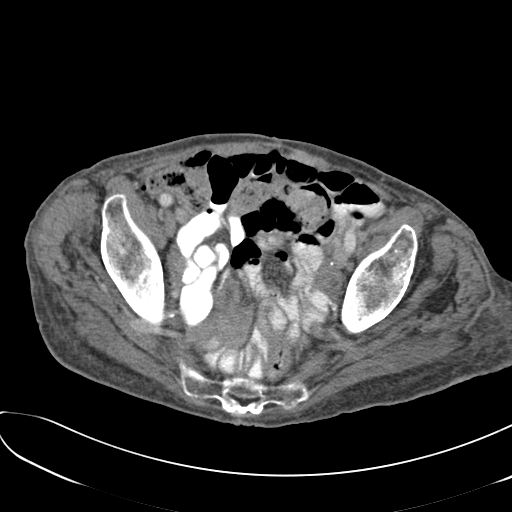
[im 61/153  soft-tissue]
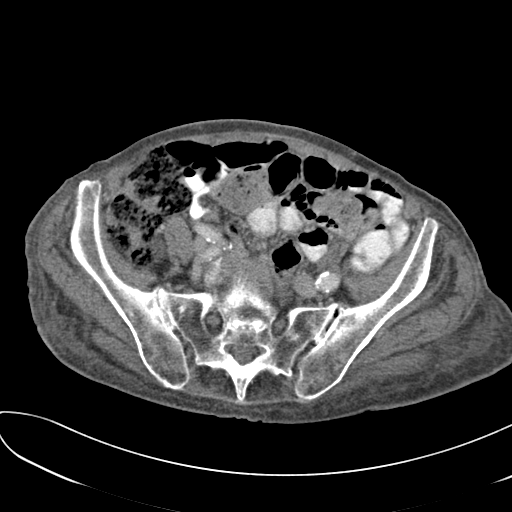
[im 73/153  soft-tissue]
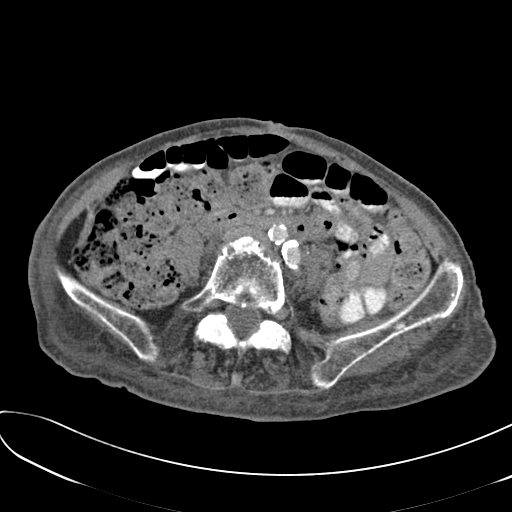
[im 86/153  soft-tissue]
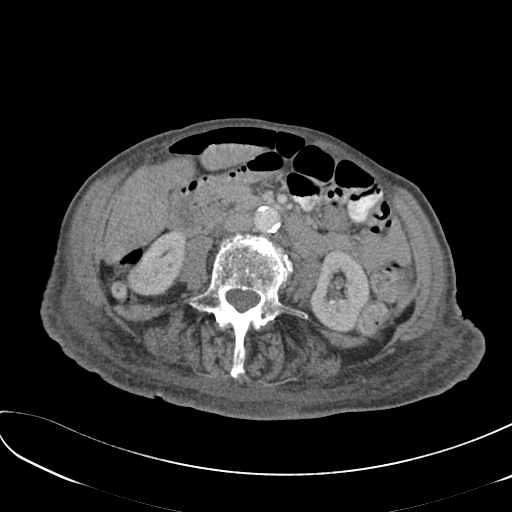
[im 98/153  soft-tissue]
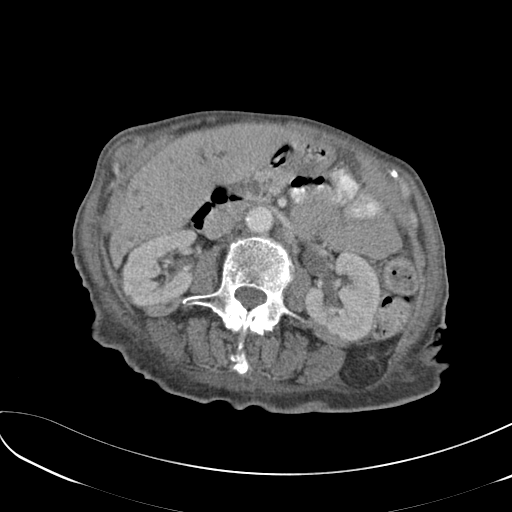
[im 110/153  soft-tissue]
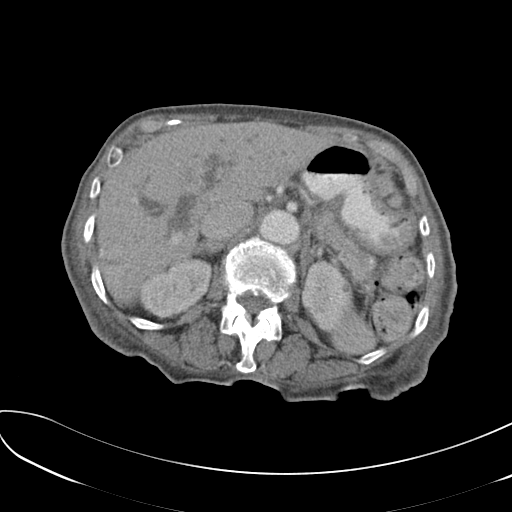
[im 110/153  bone]
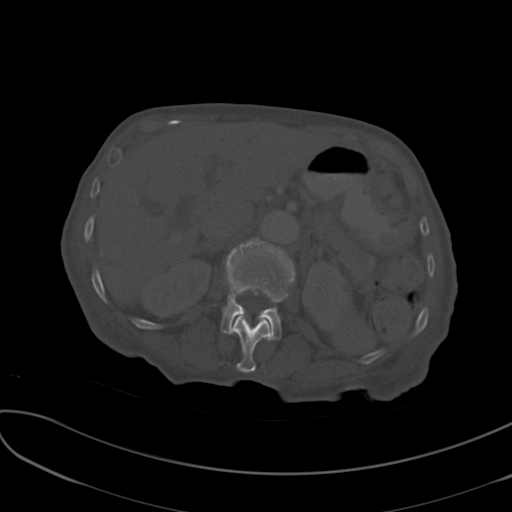
[im 122/153  soft-tissue]
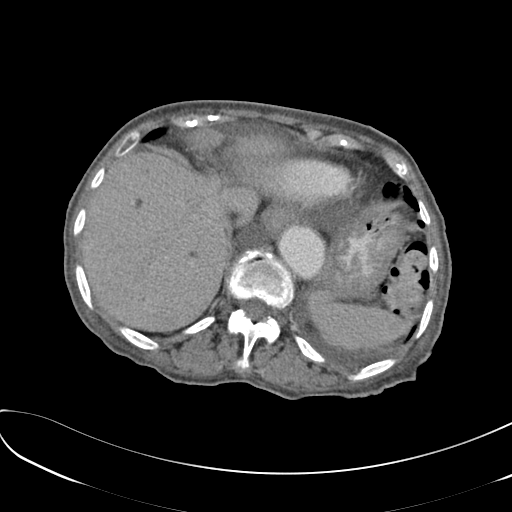
[im 128/153  lung]
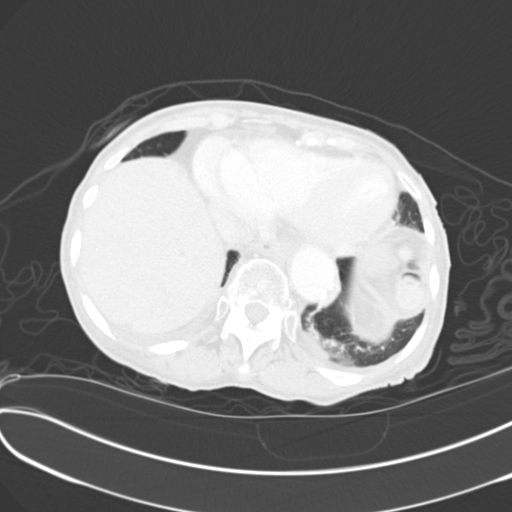
[im 134/153  soft-tissue]
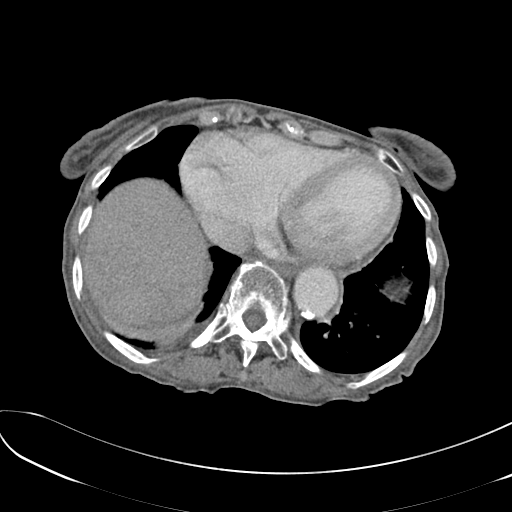
[im 134/153  lung]
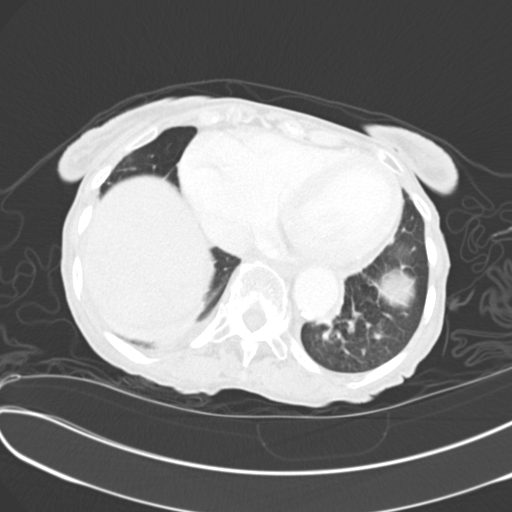
[im 140/153  lung]
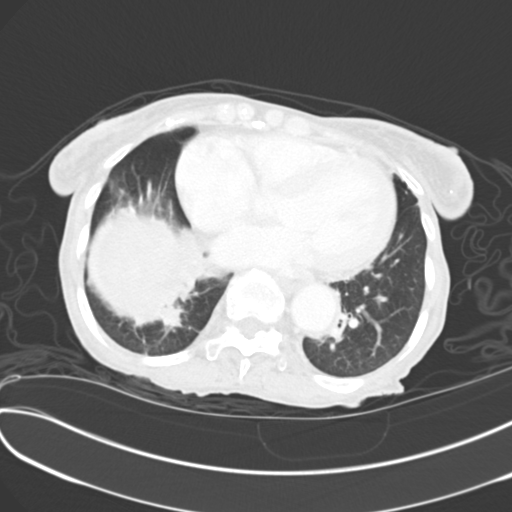
[im 146/153  soft-tissue]
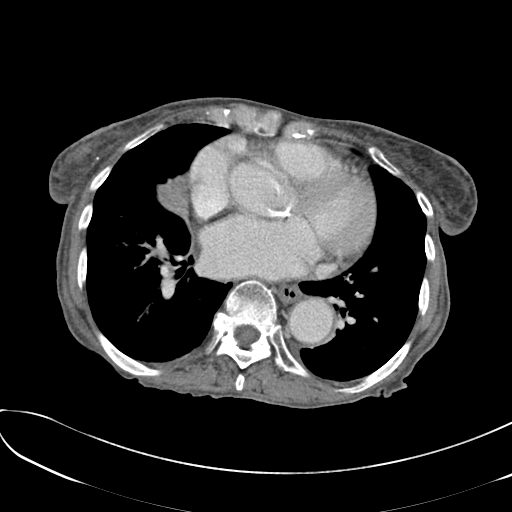
[im 146/153  lung]
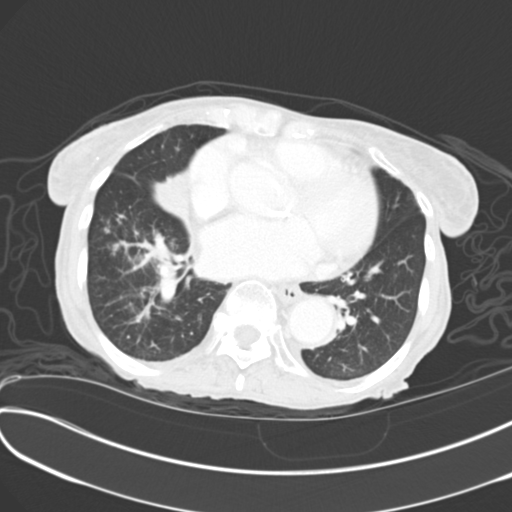

[14 of 32 positions shown; findings below may reference images not displayed]

PROCEDURE:     CT  - CT ABDOMEN / PELVIS  W  - August 09, 2011  [DATE]

RESULT:     CT of the abdomen and pelvis is performed with 100 mL of
Hsovue-SGG iodinated intravenous contrast and oral contrast. Images are
reconstructed at 3.0 mm slice thickness in the axial plane and compared
images dated 26 December, 2010 and 06 July, 2009.

The lung bases demonstrate minimal atelectasis versus infiltrate in both
lower lobes and to a lesser extent in the right middle lobe with trace
pleural effusions bilaterally. Prominent atherosclerotic calcification is
noted in the coronary vessels and aorta. There is prominent intrahepatic
biliary ductal dilation as demonstrated previously. Cholecystectomy clips
appear present. The aorta is normal in caliber. There is a defect in the
left flank containing fat and a minimal amount of fluid or soft tissue as
noted previously. An extrarenal pelvis is present on the left. There is a
suggestion of minimal left hydronephrosis. Multiple densities are seen in
the left renal collecting system could represent a nephrolithiasis. The
ureters difficult to track. No delayed images were obtained. Calcific
densities are seen in the left pelvis. A distal left ureteral calculus is
difficult to exclude. There is artifact in the left pelvic region from a
artifact from the hardware in the left hip from previous ORIF. No abnormal
bowel distention or wall thickening is evident. The right kidney is
nonobstructed. There is a small peripheral cyst in the left kidney
midportion measuring approximately 1 cm with a peripheral low-attenuation
posteriorly in the mid kidney on image 55 possibly representing a small
cyst. These appear unchanged. No focal hepatic or splenic mass is seen.
Pancreas is poorly seen because respiratory motion artifact but shows no
definite abnormality. The adrenal glands appear to be grossly unremarkable
and are also limited in evaluation because of respiratory motion.
Degenerative changes are noted in the spine. No significant ascites is seen.
No acute inflammation is evident. There is some third spacing of fluid in
the subcutaneous tissues. There is a stable fluid attenuation structure
posterior to the crus of the diaphragm to the right of the aorta anterior to
the spine measuring approximately 2.24 cm on image 33. The adjacent
esophagus appears unremarkable. The appendix is not identified area it
appears the uterus has been removed.
IMPRESSION: 1. Cannot exclude minimal obstruction the left renal collecting system. Left
nephrolithiasis is present. The distal left ureteral stone is difficult to
exclude.
2. Stable fluid density posteriorly to the crus of the diaphragm on the
right.
3. Stable intrahepatic biliary ductal dilation.
4. The appendix is not seen. Cholecystectomy and hysterectomy changes are
present.
5. Lung base trace pleural effusions with presumed atelectasis present
bilaterally.
6. Atherosclerotic disease calcification present.

## 2013-02-02 IMAGING — US US RENAL KIDNEY
1 series · 17 of 25 positions shown · non-contrast
Comparison: none

REASON FOR EXAM: left hyrdo possible renal stone
COMMENTS:

[Series 1: us renal kidney · 17 of 46 slices shown]
[im 1/46]
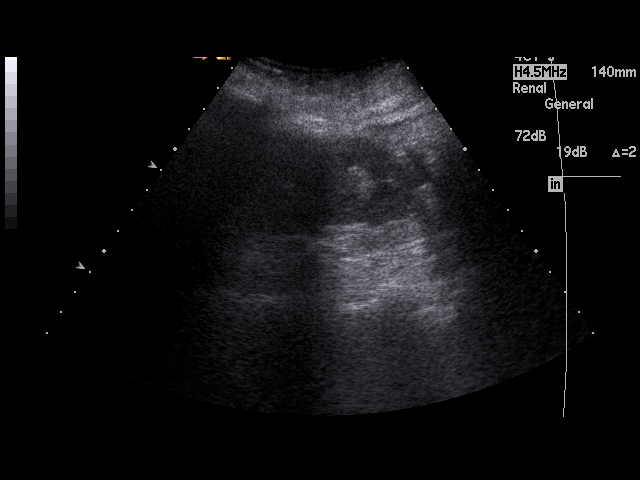
[im 4/46]
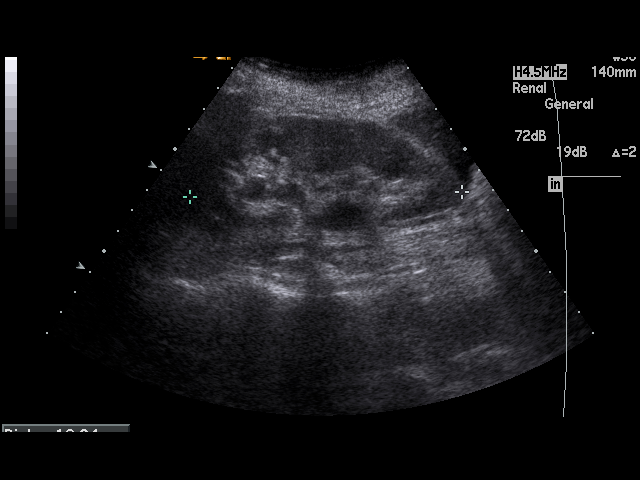
[im 6/46]
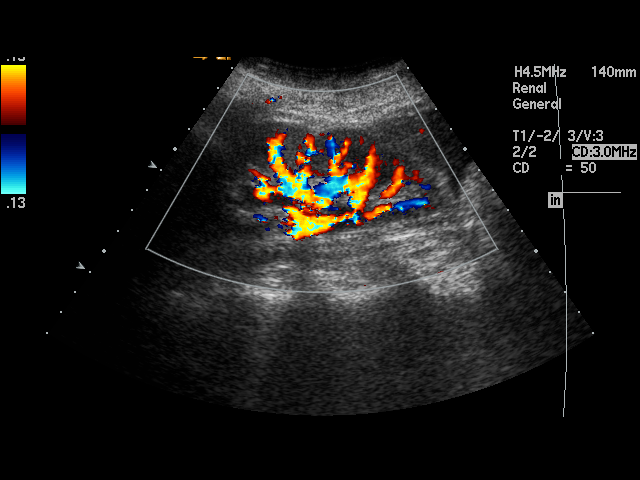
[im 10/46]
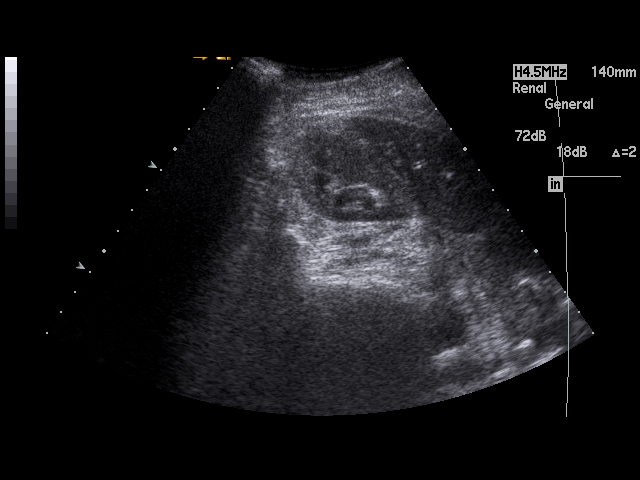
[im 12/46]
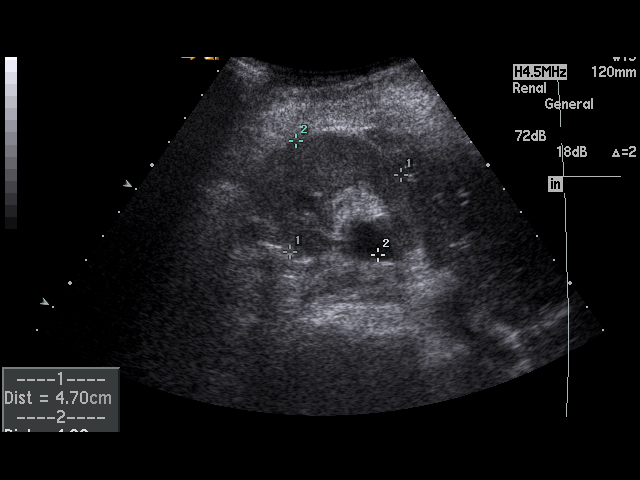
[im 16/46]
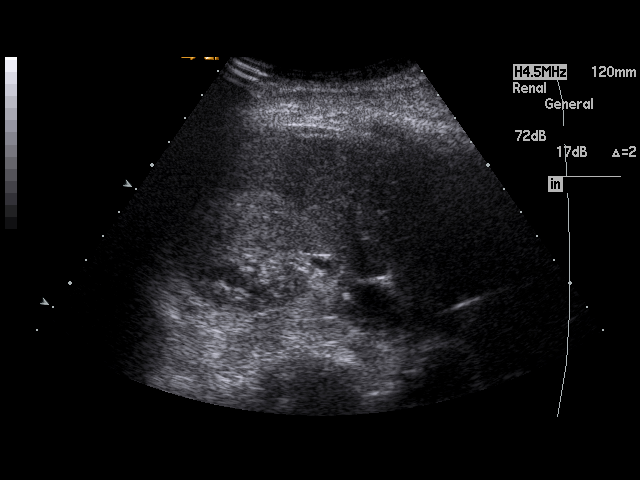
[im 17/46]
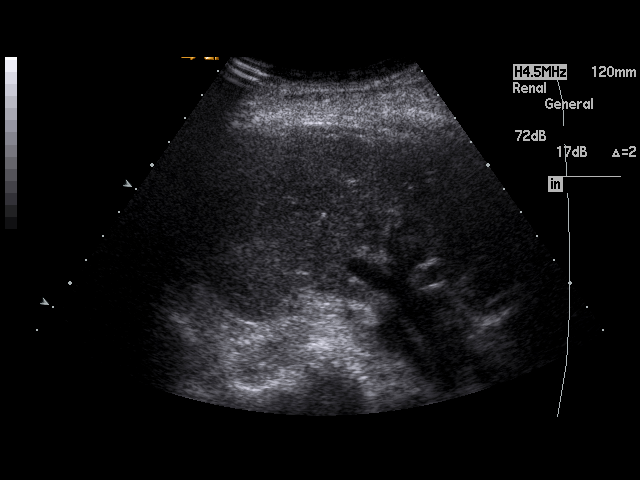
[im 21/46]
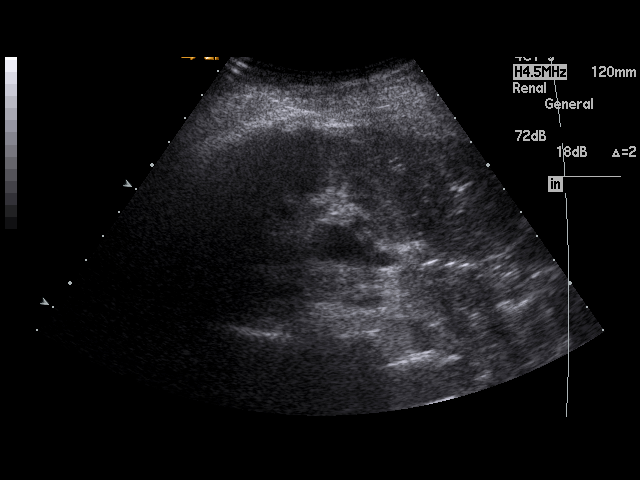
[im 23/46]
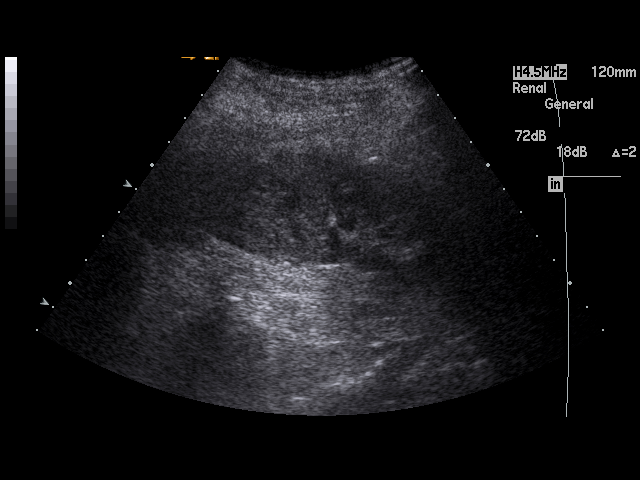
[im 25/46]
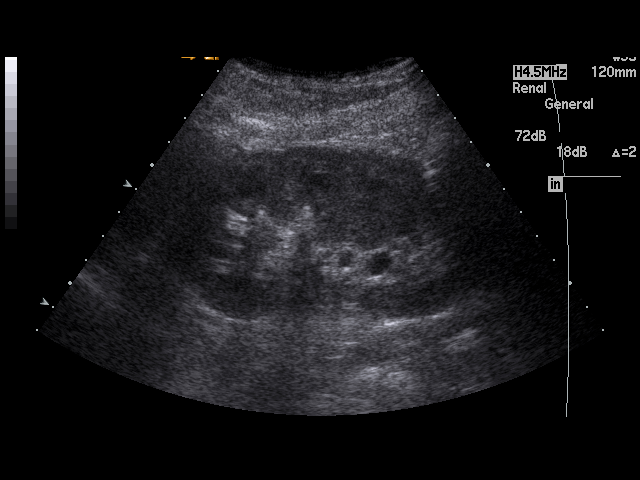
[im 29/46]
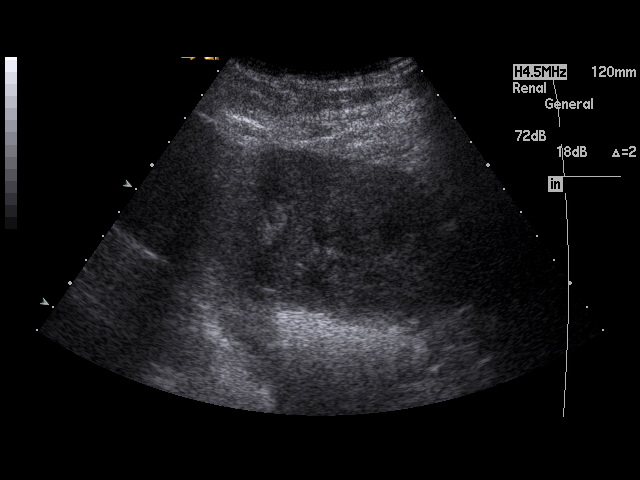
[im 31/46]
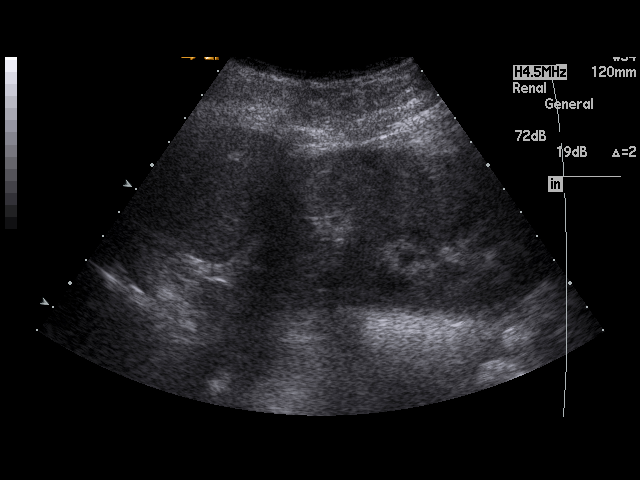
[im 34/46]
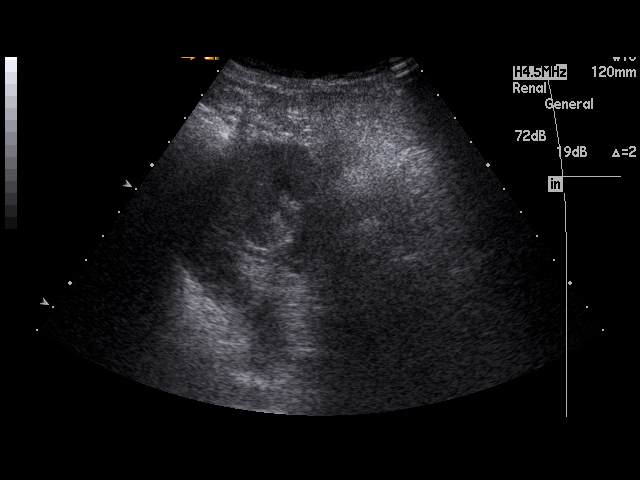
[im 36/46]
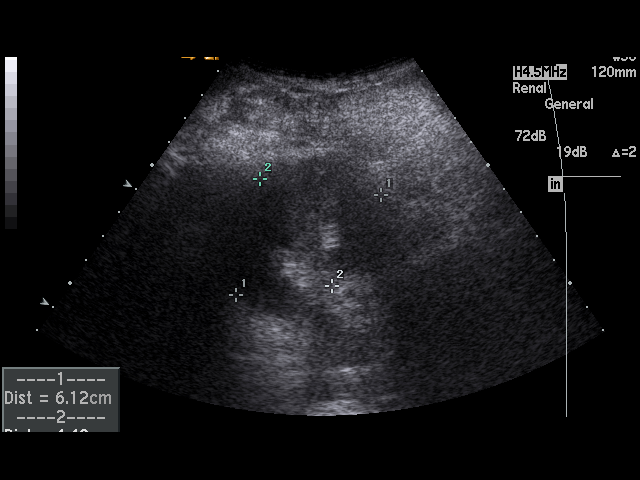
[im 40/46]
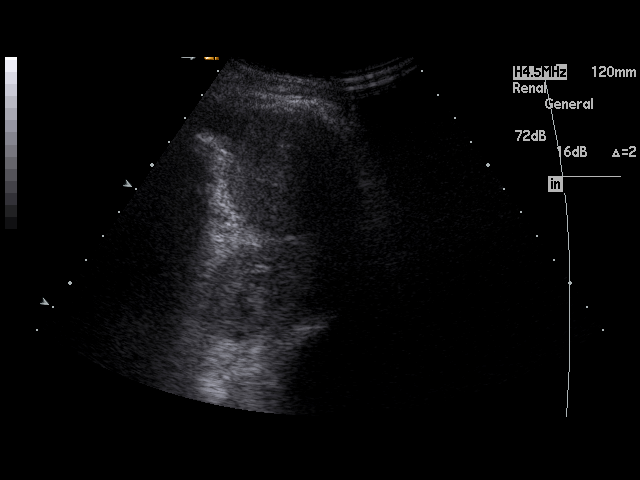
[im 42/46]
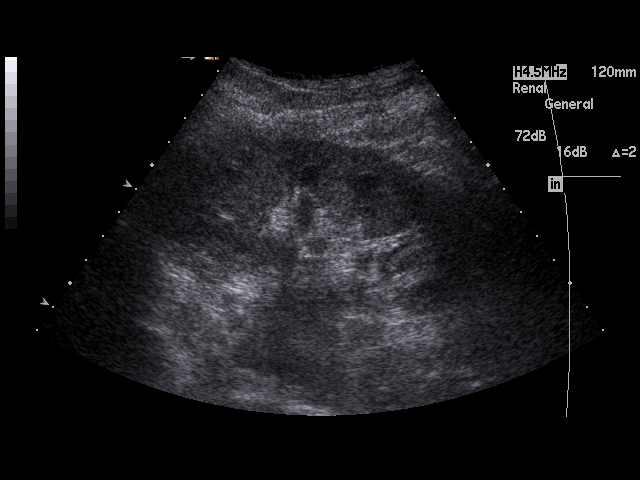
[im 46/46]
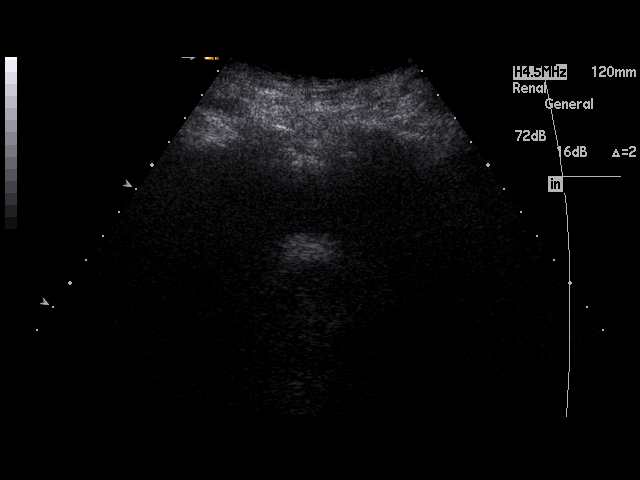

[17 of 25 positions shown; findings below may reference images not displayed]

PROCEDURE:     US  - US KIDNEY  - August 11, 2011  [DATE]

RESULT:     History: Stone disease.

Comparison Study: CT of 08/09/2011.

Findings Right kidney measures 10.9 cm, the left 10.8 cm. Mild
hydronephrosis bilaterally Bladder nondistended. Bilateral renal blood flow
noted. Kidneys are slightly echodense.
IMPRESSION: 1. Bilateral mild hydronephrosis.

Hydronephrosis on the right is greater than left. Degree of hydronephrosis
on the left appears to have diminished from prior recent CT.
2. Echogenic kidneys suggest suggesting underlying chronic renal disease.

## 2013-03-26 IMAGING — CR DG CHEST 2V
1 series · 2 of 2 positions shown · non-contrast
Comparison: none

REASON FOR EXAM: shortness of breath
COMMENTS:

[Series 1: x chest ap · 0.14mm/px · 2 of 2 slices shown]
[im 1/2]
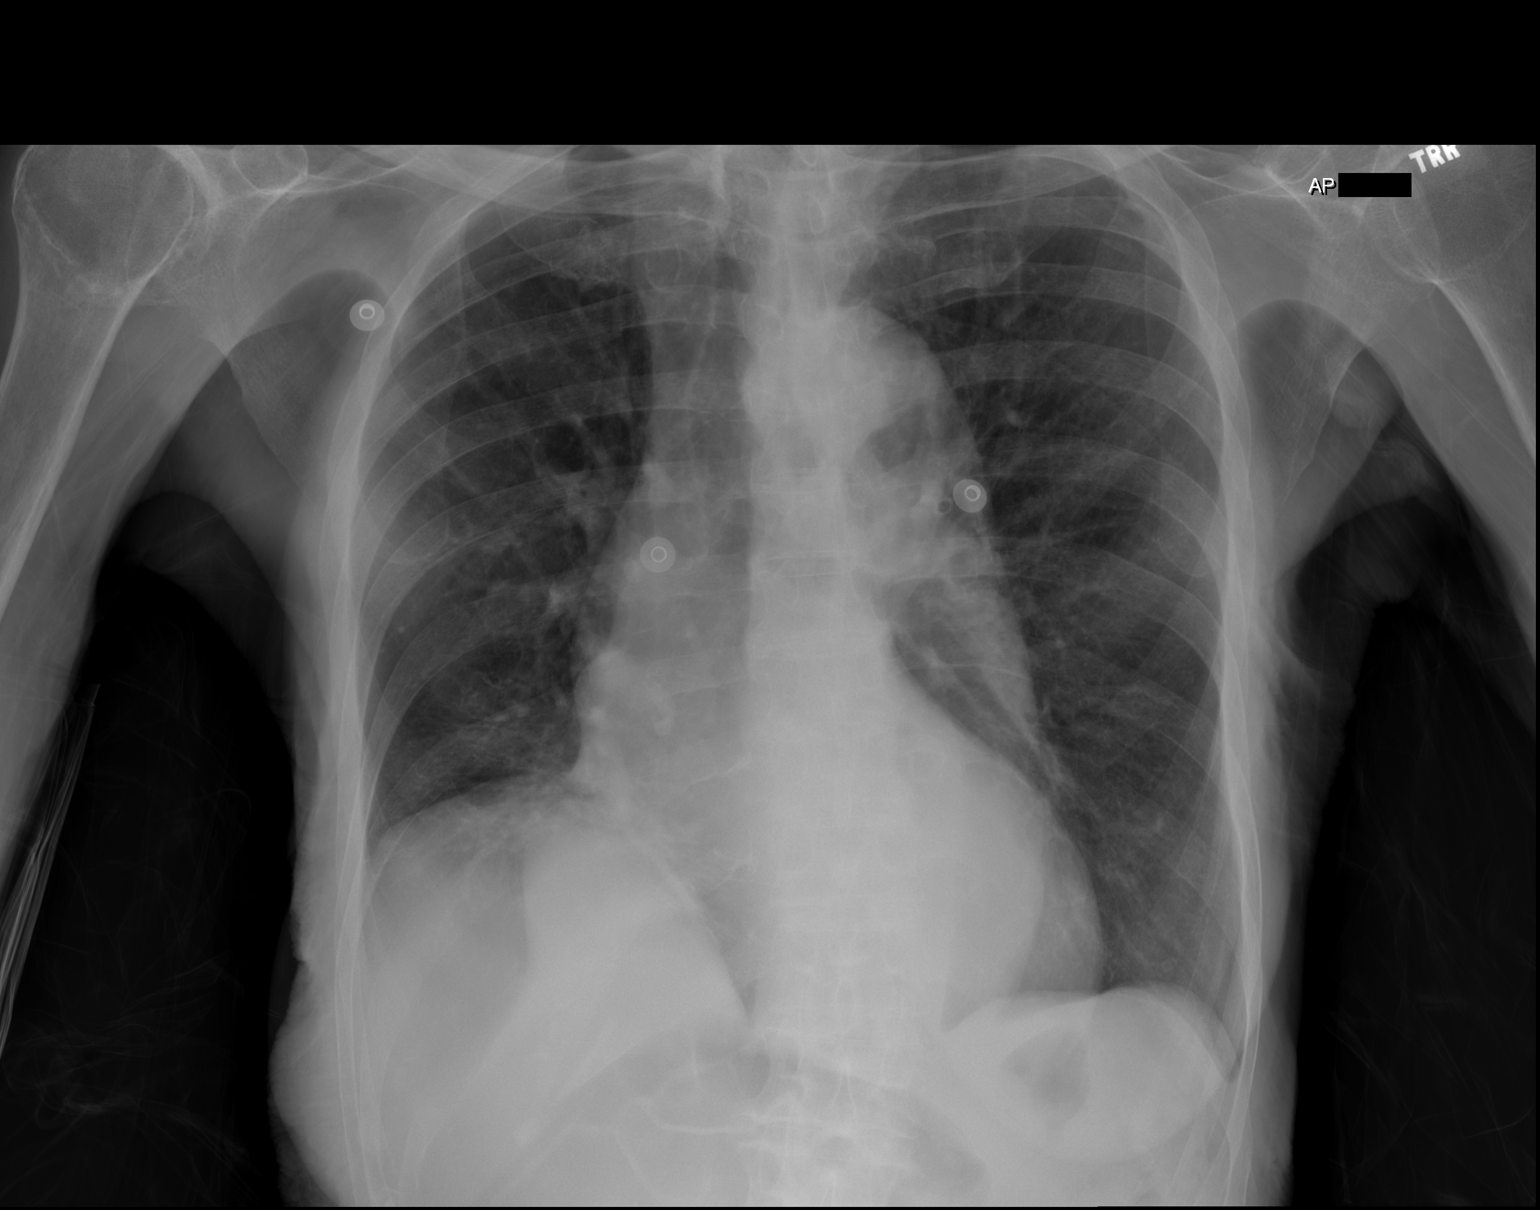
[im 2/2]
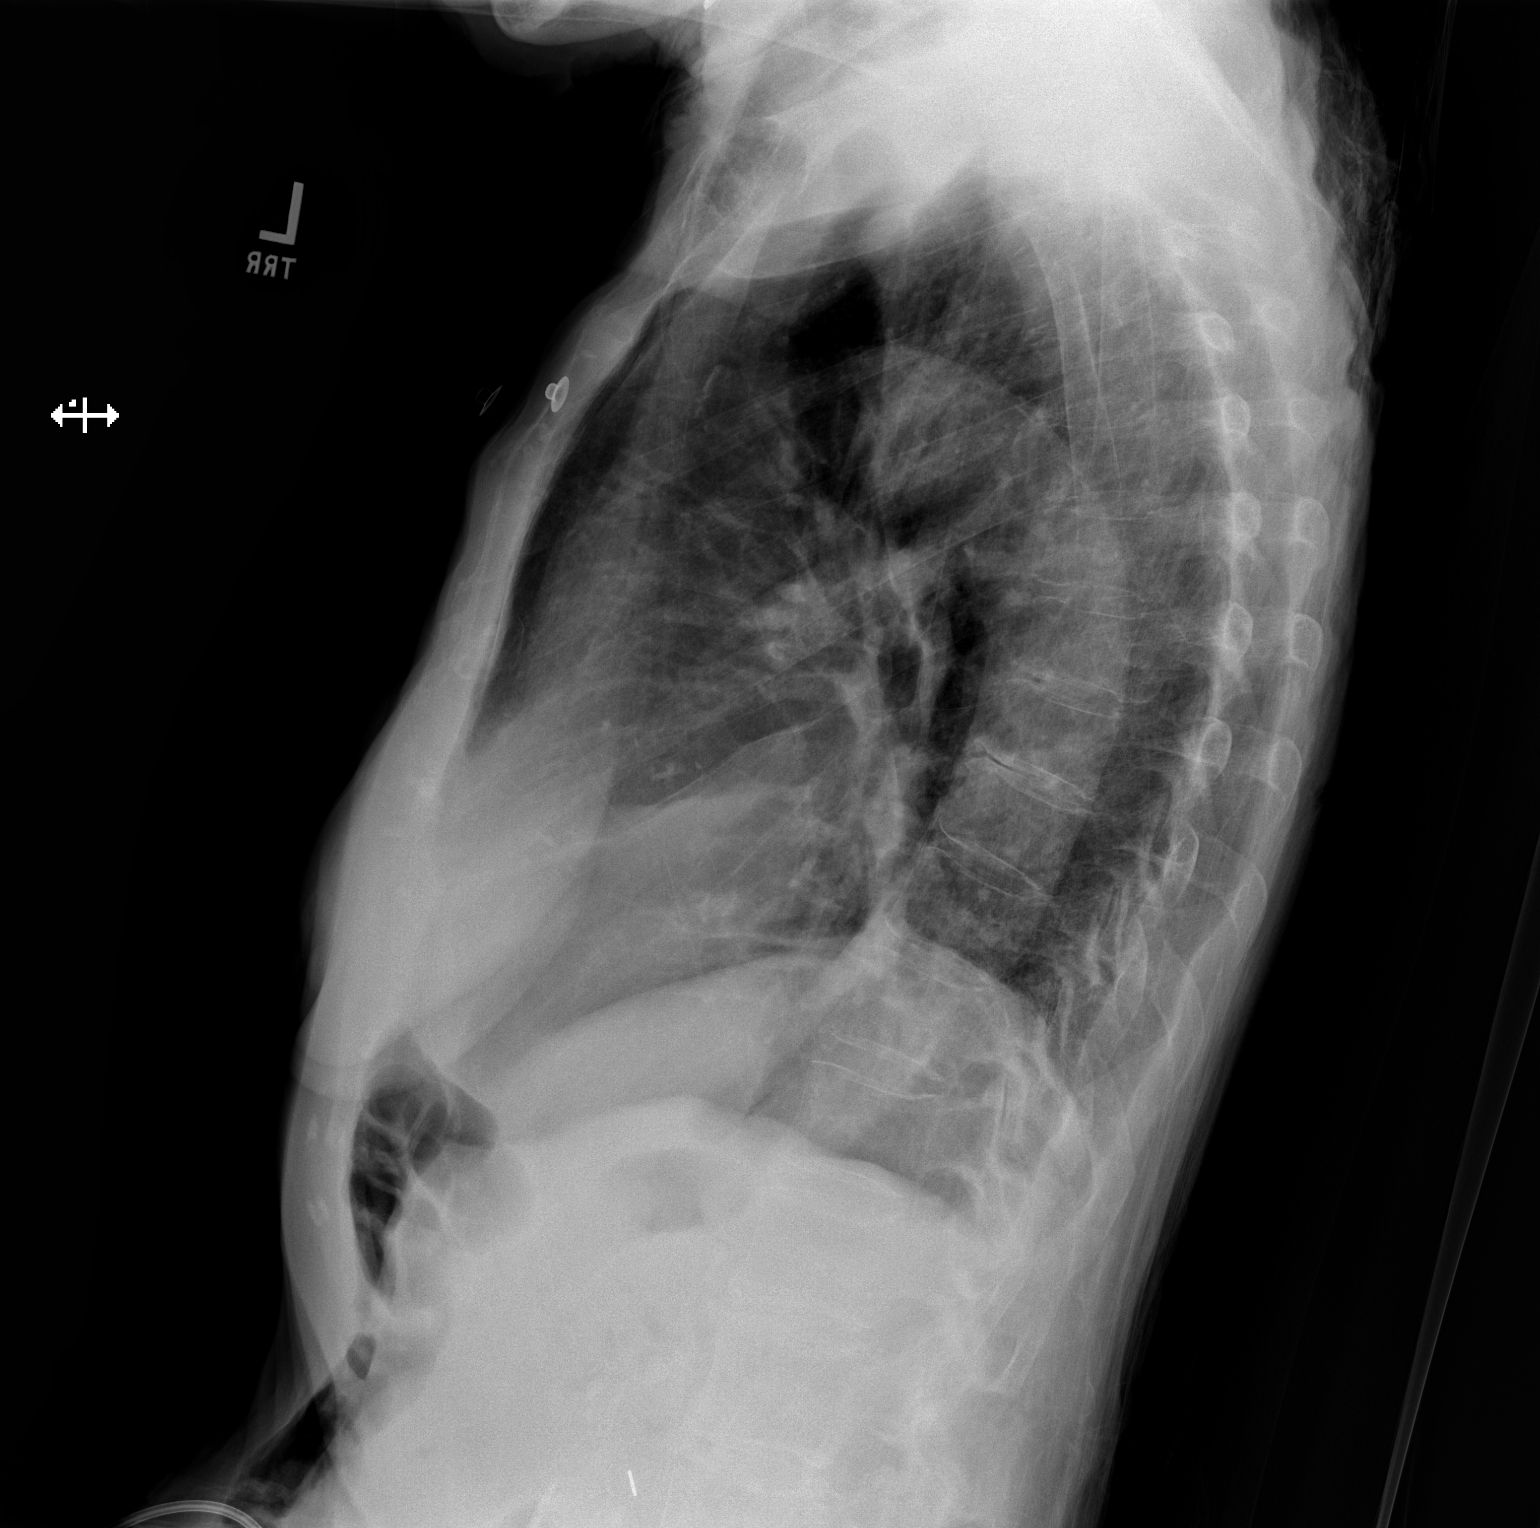

[2 of 2 positions shown; findings below may reference images not displayed]

PROCEDURE:     DXR - DXR CHEST PA (OR AP) AND LATERAL  - October 02, 2011  [DATE]

RESULT:     Comparison is made to the study 09 August, 2011.

Chronic elevation of the right hemidiaphragm is present. The cardiac
silhouette is borderline to mildly enlarged. There is no definite
infiltrate, edema, effusion, mass or pneumothorax. Atherosclerotic
calcification is noted in the aortic arch.
IMPRESSION: 1. Chronic elevation of the right hemidiaphragm. No acute cardiopulmonary
disease demonstrated. Borderline mild cardiomegaly.

[REDACTED]

## 2014-09-03 NOTE — Consult Note (Signed)
PATIENT NAME:  Tamara Harmon, Tamara Harmon MR#:  [id number] DATE OF BIRTH:  [birth year]  DATE OF CONSULTATION:  05/31/2011  REFERRING PHYSICIAN:   CONSULTING PHYSICIAN:  Scot Junobert T. Elliott, MD  HISTORY OF PRESENT ILLNESS: Patient is an 81 year old black female well known to me from previous medical problems. She had colonoscopy with me in 2008 showed a 6 mm ulcer in the cecal ascending colon area that was clipped. She also was found to have diverticulosis throughout the colon and small hemorrhoids. She had another colonoscopy done the year before that showed one single AVM of the cecum that was cauterized and destroyed. She had an upper endoscopy 2007 that showed a medium sized hiatal hernia, otherwise normal stomach and normal duodenum. She had a capsule study done 08/07/2005 that showed a normal small bowel study. These were done because of history of recurrent iron deficiency anemia and heme-positive stool. Patient was admitted because of presenting to the hospital with fatigue, shortness of breath with exertion and weakness and found to have a hemoglobin of 6.7. She was admitted for symptomatic anemia and I was asked to see her in consultation.   Patient has received blood multiple times. She also had gotten Procrit and iron transfusions.   In November she was admitted for a few days in [place] and had what sounds like a colonoscopy. I will ask that records be sent for after she signs a release.   REVIEW OF SYSTEMS: She has been tired for the last few days. She is short of breath with any exertion and short of breath somewhat at rest and she cannot lay flat. There is no abdominal pain. She has no dysphagia for food but sometimes big pills have trouble going down and need to be chopped up. There is no vomiting. She has a couple of loose stools in the morning. Ensure gives her diarrhea. She denies any rectal bleeding. She is on iron and her stools are dark. She has a couple of somewhat loose stools in the morning  and that is it for bowel movements that day. Patient complains of 40 pound weight loss over the last five years.   PAST MEDICAL HISTORY:  1. Rheumatoid arthritis.  2. Diabetes, no longer on medications for this after significant weight loss. 3. Atrial fibrillation.  4. Chronic obstructive pulmonary disease.  5. Aortic regurgitation.  6. Breast cancer with lumpectomy and radiation treatment.  7. Glaucoma   PAST SURGICAL HISTORY:  1. Bilateral knee replacements. 2. Breast lumpectomy.  3. Cholecystectomy.  4. Hysterectomy. 5. Cataracts. 6. Hip repair.    ALLERGIES: No known drug allergies.   SOCIAL HISTORY: Patient had [facility] so she was trying to get back home but she has not been helping us to do so.   FAMILY HISTORY: Mother died age 79 of stroke. Father died in his 1580s of old age. All her brothers and sisters are dead. Family history negative for cancer.   MEDICATIONS:  1. MED Pass. 2.0 b.i.d.  2. Colace 100 mg at bedtime.  3. Travoprost 1 drop right eye at bedtime.  4. Lipitor 20 mg at bedtime.  5. Ferrous sulfate 325 mg t.i.d.  6. Hydralazine 25 mg 3 tablets t.i.d.  7. Calcium carbonate 1 tablet b.i.d.  8. Vitamin C 500 mg twice a day.  9. Aspirin 81 mg a day.  10. Advair Diskus 500/50 twice a day.  11. Coreg 25 mg twice a day.  12.  Omeprazole 40 mg a day.  13. Lisinopril 40 mg a day.  14. Diltiazem 240 mg a day.  15. Folic acid 1 mg a day.  16. Lasix 40 mg a day.  17. Potassium 20 mEq a day.  18. Latanoprost 0.005% one drop left eye daily.    PHYSICAL EXAMINATION:  GENERAL: Pleasant, alert, oriented, fairly good historian for her age, elderly black female, looks a little dyspneic.   HEENT: Sclerae nonicteric. Conjunctivae pale. Tongue is negative. The head is atraumatic.   NECK: No thyromegaly. Trachea is in the midline.   CHEST: Bilaterally decreased air flow. No percussion abnormalities.   HEART: 2/6  systolic murmur, slightly irregular rhythm.   ABDOMEN: Slight tenderness to deep palpation in the mid abdomen. No hepatosplenomegaly. No masses. No bruits.   EXTREMITIES: 1 to 2+ edema bilaterally. Hands with multiple joint abnormalities consistent with rheumatoid arthritis.   RECTAL: Exam done in the ER shows dark heme-positive stool.   LABORATORY, DIAGNOSTIC AND RADIOLOGICAL DATA: Albumin 2.2, glucose 107, BUN 15, creatinine 0.6, sodium 145, potassium 4.1, chloride 106, CO2 31, calcium 8.5, white count 8.1, hemoglobin 6.7, platelet count 384. EKG shows atrial fibrillation, 132 beats a minute.   ASSESSMENT: Chronic blood loss partly from possibly recurrent AVMs in the GI tract. Some of her problem may be anemia of chronic disease and inability to recycle iron. She has crippling rheumatoid arthritis.    Patient is a little dyspneic at rest right now probably due to her anemia and may be a component of chronic obstructive pulmonary disease. She has a history of smoking and has history of rheumatoid arthritis. At this time no endoscopic procedures are indicated. Wish to get records from [place] before any plans are made. Patient is scheduled to get 2 units of blood. Will follow with you. ____________________________ Scot Jun, MD rte:cms D: 05/31/2011 13:35:25 ET T: 05/31/2011 14:05:22 ET  JOB#: [id number] cc: Tollie Pizza. Orlie Dakin, MD Dr. Azalee Course MD ELECTRONICALLY SIGNED 06/10/2011 10:56

## 2014-09-03 NOTE — Consult Note (Signed)
Pt seen and examined. See Dawn Harrison's notes. Recurrent admissions for heme positive anemia. Multiple endoscopies in the past. On O2. Lungs sound coarse. Not stable for endoscopies right now. Pt does not remember having any video capsule studies. Moniter hgb and transfuse as needed. Will follow. thanks.  Electronic Signatures: Lutricia Feilh, Windell Musson (MD)  (Signed on 24-May-13 20:01)  Authored  Last Updated: 24-May-13 20:01 by Lutricia Feilh, Islam Eichinger (MD)

## 2014-09-03 NOTE — H&P (Signed)
PATIENT NAME:  Tamara Harmon, Tamara Harmon MR#:  [id number] DATE OF BIRTH:  1923-02-06  DATE OF ADMISSION:  06/16/2011  REFERRING PHYSICIAN: Dr. Lowella Fairy PRIMARY CARE PHYSICIAN: Dr. Jorene Guest   REASON FOR ADMISSION: GI bleed, severe anemia.   HISTORY OF PRESENT ILLNESS: This is a 81 year old African American female with history of chronic anemia, has seen Dr. Orlie Dakin in the past received Procrit and iron infusion in the past, atrial fibrillation, hypertension, diabetes, chronic GI bleed who presents with anemia. Hemoglobin less than 6. Referred by nursing home, [facility]. Patient states that she has been feeling tired, fatigued, had some bright red blood per rectum when she left the hospital on the 22nd. Patient then had black, tarry stools. Of note, she was admitted from 01/19 to 01/22 for similar symptoms. Was not scoped. Received transfusion then. She states she has had no shortness of breath, no nausea, vomiting, diarrhea. No abdominal pain. She just complains of left leg lateral pain which has been ongoing. She said she had some lower extremity edema at times. She has received IV Protonix and was started with transfusion. We are asked to admit the patient for chronic GI bleed, severe anemia.   PAST MEDICAL HISTORY:  1. History of breast cancer status post lumpectomy, radiation therapy.  2. Hypertension.  3. Glaucoma.  4. Hyperlipidemia.  5. Anemia. 6. Diabetes. 7. Atrial fibrillation. 8. Chronic obstructive pulmonary disease. 9. Aortic regurgitation. 10. AV malformations.  11. Chronic GI bleed.  12. Persistent anemia.   PAST SURGICAL HISTORY:  1. Bilateral knee replacement. 2. Lumpectomy. 3. Cholecystectomy. 4. Hysterectomy. 5. Cataracts. 6. Hip repair.   SOCIAL HISTORY: Patient lives at [facility] for last two months, prior to that she was in rehab after having surgery on her left hip. She does not drink, use illicit drugs. No smoking, she quit in 2003. She is widowed with three  healthy children.   FAMILY HISTORY: Father died of natural causes age 24. Mother died at age 65 of CVA.   HOME MEDICATIONS:  1. Tramadol 50 mg every six hours p.r.n. pain.  2. Travatan 0.04% one drop at nighttime right eye. 3. Tylenol extra cap 500 mg 2 capsules 2 times a day.  4. Vitamin D3 1000 international units 1 tablet once a day. 5. Xalatan 0.05% ophthalmic solution 1 drop left eye for glaucoma. 6. Advair Diskus 500/50, 1 puff two times a day.  7. Ascorbic acid 500 mg b.i.d.  8. Aspirin 81 mg daily.  9. Ativan 0.5 mg every six hours as needed. 10. Carvedilol 25 mg 2 times a day. 11. Colace 100 mg b.i.d. 12. Diltiazem 240 mg daily. 13. DuoNebs 0.5 mg/2.5 mg, 3 mL q.i.d. p.r.n.  14. Ferrous sulfate 325 mg t.i.d.  15. Folic acid 1 mg daily.  16. Hydralazine 25 mg 3 tablets t.i.d.  17. Hydroxychloroquine 200 mg b.i.d.  18. Lasix 40 mg daily.  19. Lipitor 20 mg daily.  20. Lisinopril 40 mg daily.  21. MiraLax powder 17 grams once a day.  22. Omeprazole 40 mg once a day.  23. Oxygen 2 liters.  24. Potassium chloride 20 mEq once a day.  25. Senna 8.6 mg q.8 hours p.r.n. for bowel regimen.   DRUG ALLERGIES: Remicade.    REVIEW OF SYSTEMS: CONSTITUTIONAL: No fever. She does have some fatigue. No weakness. EYES: No blurred vision, double vision, pain, redness, inflammation. She does have glaucoma and glasses. ENT: No tinnitus, ear pain, hearing loss, seasonal allergies, epistaxis, discharge. RESPIRATORY: No cough, wheezing,  hemoptysis, dyspnea, asthma, painful respirations. CARDIOVASCULAR: No chest pain, orthopnea, edema, arrhythmias, dyspnea on exertion. She does have some edema. GASTROINTESTINAL: No nausea, vomiting, diarrhea, abdominal pain, hematemesis. She has some melena possibly but she is on iron. No gastroesophageal reflux disease as per her. GENITOURINARY: No dysuria, hematuria, renal calculi, increased frequency, incontinence. GYN/BREAST: No breast mass, tenderness, vaginal  discharge. She is postmenopausal. ENDOCRINE: No polyuria, nocturia, thyroid problems, increased sweating, heat or cold intolerance. HEME/LYMPH: No anemia, bruising, swollen glands. INTEGUMENT: No acne, rash, change in mole, hair, or skin. MUSCULOSKELETAL: No pain in back, shoulder, knee, hip, arthritis, swelling, gout. She has right thigh pain. NEUROLOGIC: No numbness, dysarthria, epilepsy, tremor, vertigo, ataxia. PSYCH: No anxiety, insomnia, ADD, bipolar, depression.   PHYSICAL EXAMINATION:  VITAL SIGNS: Temperature 97.6, heart rate 70, respiratory rate 19, blood pressure 155/72, sating 99% on 2 liters.   GENERAL: Patient is well-developed, looks cachectic, weak.   HEENT: Pupils equal, reactive to light and accommodation. Extraocular movements intact. Anicteric sclerae. No difficulty hearing. Pale conjunctivae. Dry mucous membranes. Oropharynx clear.   NECK: No JVD. No thyromegaly. No lymphadenopathy. No carotid bruits.   LUNGS: Clear to auscultation. No adventitious breath sounds. Resonant to percussion. No increased effort or use of accessory muscles.   CARDIOVASCULAR: Regular rate and rhythm. 2/6 systolic ejection murmur heard best at left sternal base. Trace lower extremity edema. 2+ dorsalis pedis pulses.   BREASTS: No obvious masses.   ABDOMEN: Soft, nontender, nondistended. Positive bowel sounds. No organomegaly. No hernia.   GENITOURINARY: Deferred.   MUSCULOSKELETAL: Globally weak. No cyanosis, degenerative joint disease.   SKIN: No rashes, lesions, induration.   LYMPH: No lymphadenopathy in cervical area.   NEUROLOGIC: Cranial nerves II through XII are intact. Strength 4/5, globally weak.   PSYCH: Alert and oriented x3.   LABORATORY, DIAGNOSTIC, AND RADIOLOGICAL DATA: Glucose 131, BUN 16, creatinine 0.85, sodium 142, potassium 4.2, chloride 103, bicarbonate 29, total protein 6.8, albumin 2.1, total bilirubin 0.2, alkaline phosphatase 97, AST 31, ALT 35, white count 9.6,  hemoglobin 6.6, hematocrit 28.6, platelets 369, MCV 89.   ASSESSMENT AND PLAN: This is a pleasant 81 year old African American female with past medical history of chronic GI bleed, anemia, breast cancer status post lumpectomy, radiation therapy, rheumatoid arthritis on Plaquenil who presents with weakness, black stool with severe anemia. Hemoglobin at outside lab was 5.9, now on admission 6.6. We are asked to admit the patient for anemia, possible GI bleed.  1. Anemia, acute on chronic, possibly due to GI bleed. Patient is going to receive blood transfusion as ordered by Emergency Room doctor. Explained the risks and benefits to patient, said she had transfusion just last week. She did not get EGD or colonoscopy and probably now warrants it. Will get GI consult. Patient has seen Dr. Orlie Dakin in the past and has been on Procrit. Will consult him.  2. GI bleed. Patient with heme positive stool as per Emergency Room. She has black stools which could be from iron. Will consult GI and put the patient on PPI drip and IV fluids. Monitor on telemetry.  3. Hypertension. Will continue Coreg but reduce the dose, Cardizem, lisinopril. Hold hydralazine and Lasix and reassess blood pressure as patient has pretty severe anemia and will be getting significant doses of antihypertensive medications. If she still maintains her blood pressure elevated will reinstitute previous doses.  4. Hyperlipidemia. Will continue Lipitor.  5. Chronic obstructive pulmonary disease. Continue DuoNeb and Advair.  6. History of coronary artery disease. We are  holding aspirin because of GI bleed. 7. Hyperglycemia. Will check A1c. She may have a history of diabetes that is diet controlled but not sure. This was noticed on previous hospitalization. 8. Rheumatoid arthritis. Continue Plaquenil. 9. Glaucoma. Continue eyedrops. 10. History of breast cancer. Stable.  11. Right leg pain. Will check lower extremity Doppler's and p.r.n. morphine. Get  physical therapy consult.  12. DVT prophylaxis maintained with TED stockings and SCDs.  13. CODE STATUS: FULL CODE.   TOTAL TIME SPENT ON ADMISSION: 55 minutes.   Patient's children are at bedside and agreeable to the plan.   Thank you for allowing me to participate in the care of this patient.   ____________________________ Corie ChiquitoAmir A. Lafayette DragonFirozvi, MD aaf:cms D: 06/16/2011 19:22:56 ET T: 06/17/2011 05:42:33 ET JOB#: [id number]  cc: Karolee OhsAmir A. Lafayette DragonFirozvi, MD, <Dictator> Abbey ChattersLloyd K. Jorene Guestomstock, MD Tollie Pizzaimothy J. Orlie DakinFinnegan, MD Coralyn PearAMIR A Kemora Pinard MD ELECTRONICALLY SIGNED 06/19/2011 14:17

## 2014-09-03 NOTE — Discharge Summary (Signed)
PATIENT NAME:  Tamara Harmon, Tamara Harmon MR#:  [id number] DATE OF BIRTH:  11/23/1922  DATE OF ADMISSION:  10/02/2011 DATE OF DISCHARGE:  10/07/2011  ADMITTING DIAGNOSIS: Abdominal pain with melena.   DISCHARGE DIAGNOSES:  1. Abdominal pain likely related to gastritis, recurrent arteriovenous malformation, and gastrointestinal bleed.  2. Acute blood loss anemia status post transfusion, hemoglobin is stable.  3. Urinary tract infection, will be on p.o. antibiotics.  4. Chronic respiratory failure, on chronic oxygen.  5. Chronic back pain likely musculoskeletal in origin.  6. History of recurrent gastrointestinal bleed with arteriovenous malformations, and has been seen by gastroenterology here multiple times and felt to be too high risk for esophagogastroduodenoscopy.  7. History of breast cancer status post lumpectomy and radiation therapy.  8. Rheumatoid arthritis.  9. Glaucoma.  10. Hyperlipidemia.  11. Diabetes.  12. History of atrial fibrillation.  13. Chronic obstructive pulmonary disease, on chronic oxygen.  14. Aortic regurgitation.  15. Status post bilateral knee replacements.  16. Status post lumpectomy.  17. Status post cholecystectomy.  18. Status post hysterectomy.  19. Status post cataract surgery.  20. Status post hip surgery.   CONSULTANTS:  1. Benita Gutterobert Gittin, MD. 2. Lynnae Prudeobert Elliott, MD - Gastroenterology. 3. Lutricia FeilPaul Oh, MD - Gastroenterology.   PERTINENT LABS/STUDIES: Admitting lipase 91. Ferritin 102, iron binding 237, iron serum 68. Troponin 0.02. BMP: Glucose 162, BUN 24, creatinine 0.87, sodium 142, potassium 4.4, chloride 103, CO2 23, calcium 8.5. Bilirubin total with slightly elevated AST. Admitting WBC count 8.6 and hemoglobin 8.7   EKG: Sinus rhythm with PACs.   Urinalysis: 2+ leukocytes.   Urine cultures: Escherichia coli, pansensitive.  CT of the abdomen and pelvis: Slight prominence of the left renal collecting system and prominent arthrosclerotic calcifications  noted in the spine.   Chest x-ray: Chronic elevation of the right hemidiaphragm.   Hemoglobin on 10/03/2011 dropped to 7.6.   HOSPITAL COURSE: Please refer to the admitting physician's history and physical. The patient is an 81 year old African American female with extensive past medical history who has had multiple admissions to the hospital, usually every two weeks, for the same complaint, of anemia as well as melena. The patient has history of AVMs and has required intermittent transfusions. The patient also gets admitted, gets transfused and hemoglobin stabilizes, gets discharged and returns back with the same complaints. She is usually seen by gastroenterology and they feel that she is high risk for esophagogastroduodenoscopy. The patient presented with similar presentation and was admitted to the hospital. Her hemoglobin and hematocrit were monitored. Gastroenterology saw the patient and recommended PPIs, transfusion as needed. The patient was again transfused. The patient's hemoglobin has remained stable. She has not noticed any further bleeding and her abdominal pain has resolved. At this point, she is stable for discharge.   DISCHARGE MEDICATIONS:  1. Phenazopyridine 100 mg 1 tab p.o. three times daily. 2. Tramadol 50 mg every 6 hours p.r.n.  3. Promethazine 25 mg every 6 hours p.r.n. nausea and vomiting. 4. Prednisone 5 mg daily.  5. Senna 1 tab p.o. every eight hours p.r.n. for constipation.  6. Ascorbic acid 500 mg 1 tab p.o. twice a day. 7. Docusate 1 tab p.o. twice a day.  8. Carvedilol 25 mg 1 tab p.o. twice a day. 9. Diltiazem 240 mg daily.  10. Iron sulfate 325 mg 1 tab p.o. three times daily.  11. Kay Ciel 20 milliequivalents p.o. daily. 12. Vitamin D 1000 international units daily.  13. Fluticasone/almeterol 500/50 one INH twice a day.  14. Hydroxychloroquine 200 mg 1 tab p.o. twice a day. 15. Acetaminophen/oxycodone 325/5 mg every 6 hours p.r.n.  16. Folic acid 0.4 mg  daily.  17. Lasix 20 mg daily.  18. Hydralazine 25 mg 1 tab p.o. three times daily. 19. Omeprazole 20 mg daily.  20. Atorvastatin 20 mg at bedtime. 21. Travoprost 0.004% one drop to each affected eye daily. 22. Latanoprost 0.005% one drop to both eyes daily.  23. Lasix 20 mg daily.  24. Omeprazole 20 mg daily.  25. Hydralazine 25 mg 1 tab p.o. three times daily. 26. Atorvastatin 20 mg daily.  27. Cipro 500 mg p.o. every 12 hours x5 days. 28. Sliding scale insulin, please refer to the discharge instructions.   ACTIVITY: As tolerated.  HOME OXYGEN: 2 liters via nasal cannula.   DIET: Low sodium ADA.   ACTIVITY: As tolerated.   REFERRALS: Physical therapy.           DISCHARGE FOLLOWUP: Followup with Dr. Reynolds Bowl, primary MD, Dr. Orlie Dakin as scheduled, and Dr. Gavin Potters for rheumatoid arthritis followup.   TIME SPENT ON DISCHARGE: 35 minutes. ____________________________ Lacie Scotts Allena Katz, MD shp:slb D: 10/07/2011 14:04:57 ET T: 10/07/2011 14:41:04 ET JOB#: [id number]  cc: Greydon Betke H. Allena Katz, MD, <Dictator> Abbey Chatters. Jorene Guest, MD Charise Carwin MD ELECTRONICALLY SIGNED 10/09/2011 13:38

## 2014-09-03 NOTE — Discharge Summary (Signed)
PATIENT NAME:  Tamara Harmon, Tamara Harmon MR#:  [id number] DATE OF BIRTH:  01/24/1923  DATE OF ADMISSION:  05/31/2011 DATE OF DISCHARGE:  06/03/2011  ADMITTING DIAGNOSIS: Sent for abnormal labs with anemia.   DISCHARGE DIAGNOSES: 1. Anemia, possibly due to chronic GI blood loss status post transfusion. Hemoglobin and hematocrit is stable. Seen by GI due to the patient having a colonoscopy and EGD recently. They recommended holding off on endoscopy at this point.  2. History of GI bleed with AVMs. Had a colonoscopy and EGD recently.  3. History of breast cancer, status post lumpectomy and radiation therapy.  4. Rheumatoid arthritis.  5. Glaucoma.  6. Constipation.  7. Hyperlipidemia.  8. Diabetes.  9. History of atrial fibrillation with RVR on presentation.   10. Chronic obstructive pulmonary disease.  11. Aortic regurgitation.  12. Status post bilateral knee replacement.  13. Status post lumpectomy.  14. Status post cholecystectomy.  15. Status post hysterectomy.  16. Status post cataract surgery.  17. Status post hip surgery.   PERTINENT LABS AND EVALUATIONS: Admitting glucose 107, BUN 15, creatinine 0.61, sodium 145, potassium 4.1, chloride 106, CO2 31, calcium 8.5. LFTs were normal except albumin of 3.2. WBC 8.1. Hemoglobin and hematocrit were 6.7 and 21.0. Platelet count 384. Troponin was negative. EKG showed atrial fibrillation. Most recent CBC on January 22nd shows hemoglobin of 8.4, platelet count 368.   HOSPITAL COURSE: The patient is an 81 year old female with history of chronic anemia who follows up with Dr. Orlie Dakin. She receives intermittent blood, sometimes Procrit, sometimes iron infusions, who was seen by Dr. Orlie Dakin in the office and noted to have a low hemoglobin. The patient did have a recent EGD and a colonoscopy for GI bleed. Due to her anemia, we were asked to admit the patient. The patient received transfusion of two units. She was monitored in the hospital. She was seen in  consultation by GI. No further bleeding was noted. Due to her recent EGD and colonoscopy, GI deferred further procedures at this time in light of the patient having no active GI bleeding. The patient also on presentation was noted to have atrial fibrillation with RVR likely due to her anemia causing stress on the heart. Her heart rate is now stable. At this point, the patient is stable to be discharged back to her skilled nursing facility. We will repeat a hemoglobin check in the next few days and make sure her hemoglobin stays steady.   DISCHARGE MEDICATIONS:  1. Carvedilol 25 1 tab p.o. b.i.d.  2. Hydroxychloroquine 200 1 tab p.o. b.i.d.  3. Diltiazem 240 extended-release 1 tab p.o. daily.  4. Os-Cal Plus Vitamin D 1 tab p.o. b.i.d.  5. Advair 500/50 mcg 1 tab b.i.d.  6. Lasix 40 daily.  7. Hydralazine 25 three tabs t.i.d.  8. Proventil 2 puffs q.4 p.r.n.  9. Tramadol 50 q.6 p.r.n.  10. Travatan 0.004% one drop to right eye at bedtime.  11. Lipitor 20 mg at bedtime.  12. Iron sulfate 65 1 tab p.o. t.i.d.  13. Tylenol 1000 mg 2 times per day.  14. DuoNebs 4 times per day as needed.  15. Ativan 0.5 q.6 p.r.n. anxiety.  16. Ascorbic acid 500 1 tab p.o. b.i.d.  17. Aspirin 81 one tab p.o. daily.  18. Omeprazole 40 1 tab p.o. daily.  19. Lisinopril 40 daily.  20. Folic acid 1 mg daily.  21. KCl 20 mEq 1 tab p.o. daily.  22. Xalatan 0.005% ophthalmic drops to the left eye once daily.  23. Colace 100 b.i.d.  24. Senna 1 tab p.o. q.8 p.r.n.  25. MiraLAX 17 grams p.o. daily.   DISPOSITION: To rehab.  OXYGEN: 2 liters.   DIET: Low sodium ADA diet.   ACTIVITY: As tolerated.   FOLLOW-UP: Follow-up Dr. Jorene Guestomstock in 1 to 2 weeks. Check a hemoglobin in seven days with results to Dr. Jorene Guestomstock.   TIME SPENT: 35 minutes.   ____________________________ Lacie ScottsShreyang H. Allena KatzPatel, MD shp:drc D: 06/03/2011 10:01:40 ET T: 06/03/2011 11:15:08 ET JOB#: [id number]  cc: Donell Tomkins H. Allena KatzPatel, MD,  <Dictator> Charise CarwinSHREYANG H Charles Andringa MD ELECTRONICALLY SIGNED 06/28/2011 9:55

## 2014-09-03 NOTE — Consult Note (Signed)
History of Present Illness:   Reason for Consult Acute on chronic anemia, likely secondary to GI source.    HPI   Patient last evaluated in the cancer Center in October 2012.  In the interim she has missed multiple clinic appointment secondary to multiple hospital admissions. She was most recently admitted to the hospital on have a hemoglobin less than 7 a significant increase in her weakness and fatigue.  Her weakness and fatigue are still present, but improved since transfusion.  She otherwise feels well.  She denies any chest pain, shortness of breath, or dyspnea on exertion.  She has noted no melena and hematochezia.  She denies any nausea, vomiting, constipation, or diarrhea.  She has a fair appetite and has maintained her weight.  Patient offers no further specific complaints today.  PFSH:   Additional Past Medical and Surgical History Past Medical History: Diabetes, CVA, Depression, Hypertension, Rheumatoid arthritis, anemia, AV malformations.  Past Surgical History: Hysterectomy in 1969, Cholecystectomy in 1972, Breast cancer with lumpectomy and radiation in 1985, no chemotherapy, Hernia repair in 1996, Bilateral knee replacement.  Family History: Negative and noncontributory.  Social History: The patient denies smoking or alcohol use.   Review of Systems:   Performance Status (ECOG) 2    Review of Systems   As per HPI. Otherwise, 10 point system review was negative.  NURSING NOTES: **Vital Signs.:   05-Feb-13 15:35    Vital Signs Type: 1 hr Post Blood    Temperature Temperature (F): 96.1    Celsius: 35.6    Temperature Source: tympanic    Pulse Pulse: 62    Pulse source: per Dinamap    Respirations Respirations: 22    Systolic BP Systolic BP: 164    Diastolic BP (mmHg) Diastolic BP (mmHg): 68    Mean BP: 100    BP Source: Dinamap    Pulse Ox % Pulse Ox %: 99    Oxygen Delivery: 2L   Physical Exam:   Physical Exam General: Thin, no acute  distress. Eyes: Anicteric sclera. Lungs: Clear to auscultation bilaterally. Heart: Regular rate and rhythm. No rubs, murmurs, or gallops. Abdomen: Soft, normoactive bowel sounds. Extremities: 1-2+ bilateral lower extremity edema. Neuro: Alert, answering all questions appropriately. Cranial nerves grossly intact. Skin: No rashes or petechiae noted. Psych: Normal affect.    Remicade: SOB    carvedilol 25 mg oral tablet: 1 tab(s) orally 2 times a day, Active, 0, None   hydroxychloroquine 200 mg oral tablet: 1 tab(s) orally 2 times a day for rheumatoid arthritis., Active, 0, None   diltiazem 240 mg/24 hours oral tablet, extended release: 1 tab(s) orally once a day for htn., Active, 0, None   Vitamin D3 1000 intl units oral tablet: 1 tab(s) orally once a day for supplement., Active, 0, None   Advair Diskus 500 mcg-50 mcg inhalation powder: 1 puff(s) inhaled 2 times a day copd., Active, 0, None   Lasix 40 mg oral tablet: 1 tab(s) orally once a day for chf., Active, 0, None   ferrous sulfate 325 mg (65 mg elemental iron) oral tablet: 1 tab(s) orally 3 times a day for anemia., Active, 0, None   hydrALAZINE 25 mg oral tablet: 3 tab(s) orally 3 times a day for htn., Active, 0, None   Tylenol Caplet Extra Strength 500 mg oral tablet: 2 tabs ( ) orally 2 times a day for pain., Active, 0, None   tramadol 50 mg oral tablet: 1 tab(s) orally every 6 hours as needed for  pain. , Active, 0, None   DuoNeb 0.5 mg-2.5 mg/3 mL inhalation solution: 3 milliliter(s) inhaled 4 times a day as needed for shortness of breath. , Active, 0, None   Travatan Z 0.004% ophthalmic solution: 1 drop(s) to right eye (at bedtime) for glaucoma., Active, 0, None   Ativan 0.5 mg oral tablet: 1 tab(s) orally every 6 hours as needed., Active, 0, None   Lipitor 20 mg oral tablet: 1 tab(s) orally once a day (at bedtime) at hyperlipidemia., Active, 0, None   ascorbic acid 500 mg oral tablet: 1 tab(s) orally 2 times a  day for supplement., Active, 0, None   aspirin 81 mg oral tablet: 1 tab(s) orally once a day, Active, 0, None   Xalatan 0.005% ophthalmic solution: 1 drop(s) to left eye once a day for glaucoma., Active, 0, None   omeprazole 40 mg oral delayed release capsule: 1 cap(s) orally once a day for gerd, Active, 0, None   Colace 100 mg oral capsule: 1 cap(s) orally 2 times a day for bowel regimen., Active, 0, None   lisinopril 40 mg oral tablet: 1 tab(s) orally once a day for htn., Active, 0, None   senna 8.6 mg oral tablet: 1 tab(s) orally every 8 hours as needed for bowel regimen., Active, 0, None   folic acid 1 mg oral tablet: 1 tab(s) orally once a day for anemia., Active, 0, None   MiraLax oral powder for reconstitution: 17 gram(s) in 8oz of fluid and drink orally once a day (bowel regimen), Active, 0, None   potassium chloride 20 mEq oral tablet, extended release: 1 tab(s) orally once a day for chf. **same as kcl**, Active, 0, None   oxgen: O2 @ 2l/min via nasal cannula as directed.  , Active, 0, None  Assessment and Plan:  Impression:   Anemia, likely secondary to chronic renal insufficiency and history of AVMs.  Plan:   1.  Anemia:  Patient has documented heme positive stools.  Appreciate GI input.  She also has chronic renal insufficiency and requires occasional Procrit.  Agree with transfusions.  Upon discharge, she will likely require chronic blood transfusions.  Therefore will have close followup in the Cancer Center to potentially avoid hospital admissions and give blood transfusions as outpatient. Given her poor cardiac status, will attempt to limit the number of blood transfusions.  Patient expressed understanding and was in agreement with this plan.  Electronic Signatures: Gerarda FractionFinnegan, Timothy (MD)  (Signed [id number]-Feb-13 16:52)  Authored: HISTORY OF PRESENT ILLNESS, PFSH, ROS, NURSING NOTES, PE, ALLERGIES, HOME MEDICATIONS, ASSESSMENT AND PLAN   Last Updated: 05-Feb-13 16:52 by  Gerarda FractionFinnegan, Timothy (MD)

## 2014-09-03 NOTE — Consult Note (Signed)
Brief Consult Note: Diagnosis: IDA secondary to chronic GI bleed.  History of AVM.  Review of Dr. Milinda CaveFinnegan's prior notes revealed the tought that.   Discussed with Attending MD.   Comments: Patient's presentation discussed with Dr. Lutricia FeilPaul Oh.  Recommendation is to continue to proceed with blood transfusion as ordered.  [place]  In review of Dr. Molly Maduroobert Elliott's note believe underlying casue is AVMs noted in colon in the past as well as suspected present in small bowel.  Will continue to monitor laboratory studies as well as hemodynamic status.  Do recommend hematology consult as patient wishes to become better connected with Dr. Gerarda Fractionimothy Finnegan.  Under hematology care and recieving blood transfusion, iron infusion and remaining on oral iron she felt much better.  Electronic Signatures: Rodman KeyHarrison, Sugar Vanzandt S (NP)  (Signed [phone]-May-13 16:02)  Authored: Brief Consult Note   Last Updated: 24-May-13 16:02 by Rodman KeyHarrison, Azarria Balint S (NP)

## 2014-09-03 NOTE — Discharge Summary (Signed)
PATIENT NAME:  Tamara Harmon, Tamara Harmon MR#:  [id number] DATE OF BIRTH:  1923/03/20  DATE OF ADMISSION:  08/09/2011 DATE OF DISCHARGE:  08/12/2011  ADMISSION DIAGNOSES:  1. Abdominal pain. 2. Back pain. 3. Worsening anemia.   DISCHARGE DIAGNOSES:  1. Abdominal pain possibly due to constipation, now resolved.  2. Back pain possible musculoskeletal in origin, now resolved.  3. Acute on chronic anemia status post transfusion of 2 units of packed red blood cells, possibly due to chronic gastrointestinal bleed, status post Gastroenterology evaluation. No intervention recommended.  4. Possible pneumonia, on oral Levaquin.  5. History of gastrointestinal bleeds with arteriovenous malformations.  6. History of breast cancer, status post lumpectomy and radiation therapy.  7. Rheumatoid arthritis.  8. Glaucoma.  9. Hyperlipidemia.  10. Diabetes.  11. History of atrial fibrillation.  12. Chronic obstructive pulmonary disease, on chronic oxygen.  13. Aortic regurgitation.  14. Status post bilateral knee replacements.  15. Status post lumpectomy.  16. Status post cholecystectomy.  17. Status post hysterectomy.  18. Status post cataract surgery.  19. Status post hip surgery.   CONSULTANT: Dr. Mechele Collin.     PERTINENT LABORATORY, DIAGNOSTIC AND RADIOLOGICAL DATA:  The patient's WBC count on admission was 14.4, hemoglobin 8.2, platelet count was 373. Subsequent hemoglobin dropped to 7.1. WBC count on March 31st was 9.8.  Her BMP glucose was 169, BUN 19, creatinine 0.78, sodium 143, potassium 4.0, chloride 102, CO2 34.  LFTs showed albumin of 2.8. The rest of the LFTs were normal.  Urine culture showed nitrites negative, leukocytes negative.  Blood cultures x2 negative.  CT of the abdomen and pelvis showed cannot exclude minimal obstruction of the left renal collecting system with left nephrolithiasis present. Stable fluid density posterior to the cusp of the diaphragm. Right stable intrahepatic biliary  duct dilation. Appendix visualized. Lung shows trace effusion with presumed atelectasis bilaterally.  Chest x-ray showed an elevation of the right hemidiaphragm with presumed some minimal right lung base atelectasis. Bilateral renal ultrasound shows bilateral mild hydronephrosis, echogenic kidneys suggest chronic kidney disease.  X-ray of her lumbar spine shows diffuse degenerative changes.   HOSPITAL COURSE: Please see History and Physical done by the admitting physician. The patient is an 81 year old white female with recurrent admissions to the hospital for anemia and various other reasons. Last hospitalization was on February 7th. She presented to the hospital with complaints of having abdominal pain and back pain. The patient had a CT scan of the abdomen as well as chest x-ray which suggested a possible pneumonia. No clear etiology for her abdominal pain was found. The patient was admitted due to her history of GI bleed. Her hemoglobin was followed. She was noted to have a drop in hemoglobin as previously.   PROBLEM LIST:  1. Back pain, abdominal pain: Felt to be due to possible constipation. No other cause was identified. The patient was able to tolerate diet without any difficulties. She was also seen by Gastroenterology, and they felt that this was not GI related. In terms of her back pain, could have been musculoskeletal pain. She does have some severe degenerative changes in her back. The patient was treated supportively improvement in her symptoms.  2. History of recurrent gastrointestinal bleed with history of arteriovenous malformations: She was seen in consultation by Gastroenterology again. Due to her multiple comorbidities, they deferred any evaluation, including endoscopy. She was transfused 2 units of packed RBCs. Her hemoglobin has remained stable. At this point, the patient is stable for discharge back to  the facility.    DISCHARGE MEDICATIONS:  1. Tramadol 50 mg every 6 hours p.r.n.   2. Promethazine 25 mg, 1 tab p.o. every 6 hours p.r.n. for nausea and vomiting.  3. Eldertonic oral liquid 30 mL b.i.d.  4. Prednisone 5 mg daily.  5. Hydrochloroquine 200 mg, 1 tab p.o. b.i.d.  6. MiraLax 17 grams in 8 ounces of water daily.  7. Folic acid 1 mg daily.  8. Senna 1 tab p.o. every 8 hours p.r.n.  9. Ascorbic acid 500 mg, 1 tab p.o. b.i.d.  10. Docusate sodium 100 mg, 1 tab p.o. b.i.d.  11. Lisinopril 40 mg daily.  12. Carvedilol 25 mg, 1 tab p.o. b.i.d.  13. Diltiazem 240 mg daily.  14. Lasix 40 mg daily.  15. Omeprazole 40 mg, 1 tab p.o. daily. 16. Ferrous sulfate 325 mg, 1 tab p.o. t.i.d.  17. Potassium chloride 20 mEq daily.  18. Vitamin D 1000 international units daily.  19. Lorazepam 0.5 every 6 hours p.r.n. anxiety. 20. Albuterol-Atrovent nebulizers q.i.d. as needed for shortness of breath.  21. Travoprost 0.004%, 1 drop to right eye daily.  22. Advair 500/50, 1 puff b.i.d.  23. Hydralazine 25 mg t.i.d.  24. Atorvastatin 20 mg at bedtime.  25. Latanoprost 0.005% ophthalmic solution to both eyes for glaucoma.  26. Percocet 5/325 p.o. every 6 hours p.r.n. pain. 27. Levaquin 500 mg p.o. daily x4 days.   HOME OXYGEN:  Yes, oxygen 2 liters. Portable tank: Yes.   DIET: Low sodium ADA diet.   ACTIVITY: As tolerated. PT and OT evaluation.   TIMEFRAME FOR FOLLOW UP:  1. The patient is to follow up with Dr. Jorene Guestomstock in 1 to 2 weeks.  2. The patient is to follow up with Dr. Gavin PottersKernodle, her primary rheumatologist, for arthritic pain. 3. Check CBC in one week at the facility.  TIME SPENT ON DISCHARGE: 35 minutes.   ____________________________ Lacie ScottsShreyang H. Allena KatzPatel, MD shp:cbb D: 08/12/2011 11:08:11 ET T: 08/12/2011 13:06:50 ET JOB#: [id number]  cc: Imri Lor H. Allena KatzPatel, MD, <Dictator> Abbey ChattersLloyd K. Jorene Guestomstock, MD Charise CarwinSHREYANG H Devine Klingel MD ELECTRONICALLY SIGNED 08/15/2011 12:16

## 2014-09-03 NOTE — Consult Note (Signed)
PATIENT NAME:  Tamara Harmon, Tamara Harmon MR#:  [id number] DATE OF BIRTH:  [birth year]  DATE OF CONSULTATION:  10/03/2011  REFERRING PHYSICIAN:  Dr. Cherylann Ratel  CONSULTING PHYSICIAN:  Lutricia Feil, MD/Esthela Brandner Mort Sawyers, NP  PRIMARY CARE PHYSICIAN: Reynolds Bowl, MD   REASON FOR CONSULTATION: Heme-positive stool, anemia, history of GI bleed.   HISTORY OF PRESENT ILLNESS: [name] is an 81 year old African American female with significant past medical history of chronic GI bleed related to AVMs as well as hypertension, diabetes, breast cancer, atrial fibrillation, chronic respiratory failure with O2 dependent chronic obstructive pulmonary disease, history of chronic right-sided heart failure with a normal LVEF, history of admission February 2013 for severe anemia in the setting of presumed gastrointestinal bleeding and AVMs.   [name] presented for the concern of abdominal pain which is apparently severe, nonradiating, associated melena though she is on iron 1 tablet 3 times a day and she does state it is very hard for her to tell whether it is truly melena versus medication-induced color change, though it is noted on history and physical that her stools have been darker over the past two weeks. She has extreme fatigue, weakness. No nausea. No vomiting. Dysuria at times. Abdominal pain according to the patient is more midabdomen and radiates through to her back. Bowels move on average every other day. No diarrhea. Appetite waxes and wanes. The patient's most recent hemoglobin according to history and physical was 10.4 and then on admission 10/02/2011 it was 8.7. Strongly heme-positive stool on digital examination.    Review of MAR reveals colonoscopy was performed by Dr. Lynnae Prude on 03/09/2007 with the findings of a few small and largemouth diverticula being found in the sigmoid colon, descending colon, transverse and ascending colon. Internal nonbleeding small hemorrhoids were noted. A single 6 mm ulcer was found  in the cecum and ascending colon. No bleeding was present. Stigma of recent hemorrhage was present. Titanium clips were applied to the ulcer and closed well. There was a small amount of bleeding with the first and the second field area. Documentation reveals that the ulcer also looked like it has granulation tissue and a small amount of blood and it would bleed if biopsied, thus, Dr. Mechele Collin did not proceed in that manner. 2005 colonoscopy revealed internal hemorrhoids. A single small bleeding colonic angiectasia and diverticulosis. 2007 EGD revealed hiatal hernia, normal stomach, normal examined duodenum. 2005 EGD revealed normal esophagus, normal duodenum, and gastritis.   The patient was seen by Dr. Lynnae Prude 08/10/2011. It is noted in his note that she is well known to him for multiple admissions in correlation with anemia and her known history of AVMs. She apparently according to his note was evaluated with a colonoscopy and upper endoscopy December 2012 for recurrent anemia and had episodic transfusions probably related to AVMs in the small bowel which are not reachable with an upper or lower scope. She was transfused January and admitted again in February for abdominal pain as well as dilated intrahepatic ducts being noted.    PAST MEDICAL HISTORY:  1. Hypertension.  2. Hyperlipidemia.  3. Diabetes mellitus. 4. Breast cancer status post lumpectomy, radiation therapy. 5. Glaucoma. 6. Anemia of chronic disease. 7. History of atrial fibrillation. 8. History of chronic respiratory failure. 9. O2 dependent chronic obstructive pulmonary disease. 10. Chronic right-sided congestive heart failure with normal LVEF. Prior echocardiogram 12/02/2010 with mild mitral regurgitation, tricuspid regurgitation, mild pulmonary hypertension.  11. History of aortic regurgitation.  12. History of chronic gastrointestinal bleed  with AVM malformations. 13. History of hospitalization in 2013 with severe anemia in  the setting of presumed gastrointestinal bleeding and arteriovenous malformations. 14. Rheumatoid arthritis. 15. Constipation. 16. Depression. 17. Diverticulosis.  ALLERGIES: Remicade.   HOME MEDICATIONS:  1. O2 2 liters nasal cannula 24/7. 2. Oxycodone with acetaminophen 5/325 mg 1 tablet every six hours as needed for pain. 3. Ascorbic acid 500 mg twice a day.  4. Atorvastatin 20 mg daily. 5. Coreg 25 mg twice a day.  6. Cholecalciferol 1000 international units daily.  7. Diltiazem 240 mg daily.  8. Colace 100 mg twice a day.  9. Eldertonic oral liquid 30 mL twice daily.  10. Iron 325 mg 3 times a day. 11. Advair Diskus 5/50 1 puff inhaled twice a day. 12. Folic acid 0.4 mg daily. 13. Lasix 20 mg daily. 14. Hydralazine 25 mg p.o. 3 times a day. 15. Hydroxychloroquine 200 mg twice a day. 16. Latanoprost 0.005 ophthalmic solution one drop both eyes daily.  17. Lisinopril 40 mg a day. 18. Omeprazole 20 mg a day.  19. Phenazopyridine 100 mg 3 times a day. 20. MiraLAX 17 grams p.o. daily.  21. Potassium chloride 20 mEq daily. 22. Prednisone 5 mg daily. 23. Phenergan 25 mg every six hours as needed nausea and vomiting. 24. Senna 1 tablet as directed for constipation.  25. Tramadol 50 mg every six hours as needed for pain. 26. Travoprost 0.004% ophthalmic solution one drop to affected eyes daily.   FAMILY HISTORY: Mother deceased at the age of 79 secondary to CVA.   SOCIAL HISTORY: No current tobacco use, quit smoking in 1995. Alcohol none. Illicit drug use none. Widow. Resident at a rehab facility, [facility], in [place], [place].  REVIEW OF SYSTEMS: CONSTITUTIONAL: No fevers. No chills. No recent weight loss or change in appetite. Some back pain as well. HEENT: No headaches. No blurred vision. No tinnitus, earache, nasal discharge, or sore throat. RESPIRATORY: Significant for chronic cough for the past 4 to 5 months, productive white thick sputum. CARDIOVASCULAR: No  chest pain or heart palpitations. GI: See history of present illness. GU: Denies any hematuria. ENDOCRINE: No heat or cold intolerance. HEME/LYMPH: Denies significant for easy bruising. INTEGUMENTARY: No rashes. No jaundice. MUSCULOSKELETAL: Significant for arthralgias, myalgias. NEUROLOGIC: Denies any headaches, TIA, or CVA. PSYCH: Some depression. No suicidal or homicidal ideations.     PHYSICAL EXAMINATION:   VITAL SIGNS: Temperature 98.8, pulse 58, respirations 20, blood pressure 150/68, pulse oximetry 99%.   GENERAL: Well developed, slender 81 year old African American female, no acute distress noted, appears to be resting comfortably in bed, currently receiving 1 unit packed red blood cells.   HEENT: Normocephalic, atraumatic. Pupils equal and reactive to light. Conjunctivae pale.   NECK: Supple. Trachea midline. No lymphadenopathy or thyromegaly.   PULMONARY: Symmetric rise and fall of chest. Scattered rales noted to left lower lobe anterior as well as posteriorly. Otherwise, clear to auscultation.   CARDIOVASCULAR: Regular rate and rhythm. S1, S2. Distant heart sounds.   ABDOMEN: Soft, nondistended. Mild discomfort left lower quadrant. No rebound tenderness. No evidence of hepatosplenomegaly, masses, or bruits.   RECTAL: Deferred. Previously performed with evidence of grossly guaiac-positive stool.   EXTREMITIES: No contractures. No clubbing.   SKIN: No rashes, lesions.   NEUROLOGICAL: Cranial nerves II through XII are intact.   PSYCH: Alert and oriented x4.   LABORATORY, DIAGNOSTIC, AND RADIOLOGICAL DATA: Chemistry panel on admission 10/02/2011 glucose 162, BUN 24, TIBC is low at 237, otherwise within normal  limits. Hepatic panel albumin 2.7 with an AST of 42, otherwise within normal limits. Cardiac enzymes, all three in series, within normal limits. CBC WBC 8.6 with an RBC of 3.17, hemoglobin 8.7 with hematocrit of 28.9. MCHC is 30.2 with RDW of 18.1. Hemoglobin has trended  during admission 7.6 to 7.5 to 7.8, hematocrit 24.3. Antibody screen was negative. Type and crossmatched for 3 units of blood, one has been issued. ABO group with Rh type was A+. Urinalysis +2 leukocytes, RBCs 2 per high-power field, WBCs 31 per high-power field, +1 bacteria.   CT of abdomen and pelvis without contrast revealed slight prominence of left renal collecting system and extrarenal pelvis is present. Right renal collecting system also shows slight prominence. Prominent atherosclerotic calcification. Degenerative changes are also noted in the spine. Atelectasis versus early pneumonia in the right lung base.   Chest, PA and lateral, chronic elevation of right hemidiaphragm. No acute cardiopulmonary disease is demonstrated. Borderline mild cardiomegaly.   IMPRESSION: Chronic iron deficiency anemia secondary to chronic blood loss, known history of AVMs. Review of Dr. Milinda Cave prior office notes revealed the thought that chronic iron deficiency anemia is probably multifactorial related to AVMs which have previously been noted on endoscopic evaluation as well as CRI and possible underlying bone marrow disorder.   PLAN: The patient's presentation was discussed with Dr. Lutricia Feil. At this time recommend proceeding with a blood transfusion as ordered. Would consider hematological consult as the patient wishes to be better established with Dr. Orlie Dakin again. Given the fact that most recent endoscopic evaluation was 2012 would proceed cautiously at this time with proceeding with endoscopic evaluation being repeated at this point. Will continue to monitor during hospitalization as well as monitoring hemodynamic as well as laboratory evaluation results.   These services provided by Rodman Key, NP under collaborative agreement with Lutricia Feil, MD.  ____________________________ Rodman Key, NP dsh:drc D: 10/03/2011 15:59:12 ET T: 10/04/2011 11:09:27 ET JOB#: [id number]  cc: Rodman Key, NP,  <Dictator> Rodman Key MD ELECTRONICALLY SIGNED 10/08/2011 15:29

## 2014-09-03 NOTE — Consult Note (Signed)
Pt with chronic blood loss without source seen on colon and EGD a few months ago.  Pt with CHF, BNP elevated.  chest with exp sounds, decreased air flow.  She needs monthly hgb draws to keep her from having to be admitted. Pt wants to go home when able, can advance diet to soft if not already on it.  Possible small bowel source but not likely can do anything about it given her overall health.  Electronic Signatures: Scot JunElliott, Robert T (MD)  (Signed on 06-Feb-13 15:19)  Authored  Last Updated: 06-Feb-13 15:19 by Scot JunElliott, Robert T (MD)

## 2014-09-03 NOTE — Discharge Summary (Signed)
PATIENT NAME:  Tamara Harmon, Tamara Harmon MR#:  [id number] DATE OF BIRTH:  [birth year]  DATE OF ADMISSION:  06/16/2011 DATE OF DISCHARGE:  06/19/2011  ADMITTING DIAGNOSIS: Dark tarry stools, anemia.   DISCHARGE DIAGNOSES:  1. Anemia felt to be due to possible chronic gastrointestinal blood loss with history of arteriovenous malformations in the past. The patient is status post transfusion two units of packed red blood cells, status post gastroenterology evaluation here. They felt that she is not a candidate to undergo esophagogastroduodenoscopy based on her multiple comorbidities. Her hemoglobin is currently stable for the last 48 hours.  2. History of gastrointestinal bleed with arteriovenous malformations, had a colonoscopy and esophagogastroduodenoscopy recently.  3. History of breast cancer status post lumpectomy and radiation therapy.  4. Rheumatoid arthritis.  5. Glaucoma.  6. Chronic constipation.  7. Hyperlipidemia.  8. Diabetes.  9. History of atrial fibrillation.  10. Chronic obstructive pulmonary disease, on chronic oxygen.  11. Aortic regurgitation.  12. Status post bilateral knee replacements.  13. Status post lumpectomy.  14. Status post cholecystectomy.  15. Status post hysterectomy.  16. Status post cataract surgery.  17. Status post hip surgery.   LABORATORY, RADIOLOGICAL AND DIAGNOSTIC DATA:  On presentation the patient's glucose was 131, BUN 16, creatinine 0.85, sodium 142, potassium 4.2, chloride 103, CO2 29. Ferritin 41, lipase 69, LDH 209. LFTs showed albumin of 2.1. Troponin less than 0.02. WBC count 9.6, hemoglobin 6.6, platelet count 369. B12 levels were normal. Most recent hemoglobin and hematocrit today shows a hemoglobin of 9.7. Chest x-ray: Mild right basilar opacities may be related to atelectasis from the right hemidiaphragm. Doppler's of lower extremity showed no evidence of deep vein thrombosis.   CONSULTANT: Dr. Mechele CollinElliott.   HOSPITAL COURSE: Please see history and  physical done by the admitting physician. The patient is an 81 year old white female who was recently hospitalized from 01/19 to 01/22. At that time was admitted with anemia requiring transfusion. She has a history of AVMs in the past. She was seen by gastroenterology at that time, felt too high risk for any esophagogastroduodenoscopy or colonoscopy. The patient was discharged in a stable condition. She was referred by nursing home due to progressive feeling of tiredness, fatigue and had some bright red blood per rectum. She was noted to have a hemoglobin of 6.7. Therefore, we were asked to admit the patient again. She was given blood transfusion. She actually had to receive 2 units of packed RBCs. She was seen in consultation by gastroenterology, Dr. Mechele CollinElliott, who felt that she is too high risk for any type of procedures. He recommended monitoring her hemoglobin and hematocrit as an outpatient, transfusioning her as needed. At this time, the patient is stable for discharge.   DISCHARGE MEDICATIONS:  1. Carvedilol 25, one tab p.o. b.i.d.  2. Hydroxychloroquine 200, one tab p.o. b.i.d.  3. Diltiazem 240 daily.  4. Vitamin D3, 1000 international units daily.  5. Advair 500/50 INH b.i.d.  6. Lasix 40 daily.  7. Iron sulfate 325 mg, one tab p.o. t.i.d.  8. Hydralazine 25 mg 3 tabs t.i.d.  9. Tylenol Extra Strength 500, two tabs b.i.d. for pain.  10. Tramadol 50 q.6 p.r.n.  11. DuoNebs 4 times daily as needed for shortness of breath. 12. Travatan 0.004% ophthalmic solution one drop to right eye.  13. Ativan 0.5 mg q.6 hours. 14. Lipitor 20 daily.  15. Ascorbic acid 500, one tab p.o. b.i.d.  16. Aspirin 81, one tab p.o. daily.  17. Xalatan 0.005% one drop  to left eye once a day.  18. Omeprazole 40 daily.  19. Colace 100, one tab p.o. b.i.d.  20. Lisinopril 40 daily.  21. Senna 1 tab p.o. q.8 p.r.n.  22. Folic acid 1 mg daily.  23. MiraLAX 17 grams orally once a day. 24. Potassium chloride 20 mEq  daily.  25. Home oxygen at 2 liters.   DIET: Low sodium ADA diet.   ACTIVITY: As tolerated.   REFERRAL: Rehab, physical therapy evaluation and treat.    FOLLOW-UP:  1. Dr. Jorene Guest in 1 to 2 weeks.  2. The patient is referred to outpatient gastroenterology evaluation at [facility] for a second opinion regarding esophagogastroduodenoscopy and colonoscopy.   TIME SPENT: 35 minutes.   ____________________________ Lacie Scotts Allena Katz, MD shp:ap D: 06/19/2011 09:43:49 ET T: 06/19/2011 09:51:16 ET JOB#: [id number]  cc: Tamara Harmon H. Allena Katz, MD, <Dictator> Abbey Chatters. Jorene Guest, MD Dr. Felicity Pellegrini  Charise Carwin MD ELECTRONICALLY SIGNED 06/28/2011 10:02

## 2014-09-03 NOTE — Consult Note (Signed)
VSS BP up a bit, chest with insp rales left base.  Hgb mid 8 range.  No endo plans given her condition and previous EGD and colon 6 months ago in [place].  Electronic Signatures: Scot JunElliott, Robert T (MD)  (Signed on 27-May-13 18:21)  Authored  Last Updated: 27-May-13 18:21 by Scot JunElliott, Robert T (MD)

## 2014-09-03 NOTE — Consult Note (Signed)
Pt feels better after transfusion, less dyspnea, breathing better.  Hgb up to 8.5 plt 335, Met B  OK, 100% on 2L.  Awaiting records from [place], no plans to do colonoscopy or EGD at this time.  Asking for coffee, can advance to full liquids if you like.   Electronic Signatures: Manya Silvas (MD)  (Signed on 20-Jan-13 10:01)  Authored  Last Updated: 20-Jan-13 10:01 by Manya Silvas (MD)

## 2014-09-03 NOTE — Consult Note (Signed)
PATIENT NAME:  Tamara Harmon, Tamara Harmon MR#:  [id number] DATE OF BIRTH:  [birth year]  DATE OF CONSULTATION:  08/10/2011  REFERRING PHYSICIAN:   CONSULTING PHYSICIAN:  Scot Junobert T. Tilmon Wisehart, MD  HISTORY OF PRESENT ILLNESS:  The patient is an 81 year old black female well known to me from previous admissions who was admitted at this time with multiple medical problems and complaints which include pneumonia of the right, constipation, abdominal pain in the right abdomen radiating into her back. I was asked to see her in consultation.   The patient has a history of anemia and AVMs in the past. She was evaluated in [place] with a colonoscopy and an upper endoscopy in December 2012 for anemia. She has recurrent anemia and has to have episodic transfusions, probably for AVMs of the small bowel which are not reachable with an upper or lower scope. She was transfused in January and was admitted again in February for abdominal pain and dilated intra and extrahepatic ducts.   This admission she has abdominal pain in the lower abdomen and mid upper abdomen radiating into her back. She has pneumonia on a CT scan and chest x-ray.   REVIEW OF SYSTEMS: She denies any chest pain. She does have some shortness of breath and abdominal pain. No vomiting. No bleeding. No hematochezia. No hematemesis. No melena. No fever or chills. No nosebleeds. She does have constipation and has been on MiraLAX and Senokot for this. She does have a history of chronic low back pain.   OTHER MEDICAL PROBLEMS: 1. Recurrent severe anemia probably due to AVMs.  2. Previous breast cancer status post lumpectomy and radiation.  3. Hypertension.  4. Glaucoma.  5. Hyperlipidemia.  6. Anemia of chronic disease, probably from arteriovenous malformations as well.  7. Diabetes.  8. Atrial fibrillation.  9. Chronic obstructive pulmonary disease on chronic oxygen 2 liters.  10. Aortic regurgitation. 11. Congestive heart failure.  12. Rheumatoid arthritis.   13. Constipation.  14. Depression.  15. Diverticulosis.   PAST SURGICAL HISTORY:  1. Bilateral knee replacements.  2. Lumpectomy.  3. Cholecystectomy.  4. Hysterectomy.  5. Bilateral cataract surgery.  6. Left hip repair.   ALLERGIES: Remicade.  MEDICATIONS:  Medication list is quite long and includes: 1. Prednisone 5 mg a day.  2. Latanoprost one drop both eyes at bedtime.  3. Omeprazole 40 mg a day.  4. Lisinopril 40 mg a day.  5. Diltiazem extended release 240 mg a day.  6. Furosemide 40 mg a day.  7. Atorvastatin 20 mg a day.  8. Folic acid 1 mg a day.  9. Vitamin D3 1000 units a day.  10. KCl 20 mEq a day.  11. MiraLAX 17 grams in a glass of water daily.  12. Ascorbic acid 500 mg daily. 13. Docusate 100 mg b.i.d.  14. Hydroxychloroquine 200 mg b.i.d. 15. Advair Diskus 500/50, 1 puff b.i.d.  16. Carvedilol 25 mg b.i.d.  17. Acetaminophen 1 gram b.i.d. 18. Ferrous sulfate 325 t.i.d.  19. Hydralazine 75 mg t.i.d.  20. Travoprost 0.004% one drop right eye at bedtime.  21. Senokot every eight hours as needed. 22. Lorazepam 0.5 mg q. 6 hours as needed.  23. DuoNebs q.i.d. as needed.  24. Oxygen 2 liters continuous.  25. Phenergan 25 mg q. 6 hours as needed.   PHYSICAL EXAMINATION:  GENERAL: Elderly black female in no acute distress.   HEENT: Sclerae are anicteric. Conjunctivae not as pale as in the past.  Tongue is not as pale as in  the past.  Head is atraumatic. Trachea is in the midline.   CHEST: Decreased breath sounds and congestion in the right lung.   HEART: 2 to 3/6 systolic murmur.   ABDOMEN:  Abdomen shows what feels like palpable loops of bowel with stool in them. No masses otherwise. No hepatosplenomegaly. She has some tenderness in lower abdomen and right abdomen.   RECTAL: No impaction. No stool to check.   EXTREMITIES: No edema.   LABORATORY DATA: Glucose 76, magnesium 1.4, CPK and MBs were negative. Troponin is negative. White count 14.4,  hemoglobin 8.2, hematocrit 25.9, platelet count 373. ProTime 13.8, INR 1, total protein 7.3,  albumin 2.8, total bilirubin 0.3, alkaline phosphatase 120, SGOT 17, SGPT 30.   ASSESSMENT:   1. Patients often have dilated ducts after gallbladder removal.  In the absence of elevated liver functions I see nothing acutely changed or different or of clinical importance.  2. Abdominal pain can definitely be seen in patients who have pneumonia, particularly right lower lobe pneumonia.  3. She does have chronic constipation and her x-ray shows there is a significant amount of stool in her colon. This can be treated with Dulcolax pills and extra doses of MiraLAX.  Given her previous recent endoscopy and colonoscopy and her very poor overall health, would not recommend endoscopic evaluations at this time.   RECOMMENDATIONS:  1. Dulcolax pills, MiraLAX liquid, alternate.  2. Follow serial liver functions.  3. Treat pneumonia. Consider pulmonary and infectious disease consults.  4. I would clarify CODE STATUS and be sure that the patient wants to be FULL CODE.    ____________________________ Scot Jun, MD rte:bjt D: 08/10/2011 08:26:51 ET T: 08/10/2011 09:13:15 ET JOB#: [id number]  cc: Scot Jun, MD, <Dictator> Scot Jun MD ELECTRONICALLY SIGNED 08/25/2011 11:51

## 2014-09-03 NOTE — H&P (Signed)
PATIENT NAME:  Tamara Harmon, Tamara Harmon MR#:  [id number] DATE OF BIRTH:  12-Mar-1923  DATE OF ADMISSION:  10/02/2011  REFERRING PHYSICIAN: Maricela Bo, MD  PRIMARY CARE PHYSICIAN: Reynolds Bowl, MD  CHIEF COMPLAINT: "My stomach hurts."   HISTORY OF PRESENT ILLNESS: The patient is an 81 year old female with extensive past medical history as listed below who presents to the emergency department with the above-mentioned chief complaint. The patient has a history of chronic GI bleed with AVMs and presents with abdominal pain, severe, 10/10, which is nonradiating and associated with melena and dark stools over the past two weeks. She also reports back pain over the past few weeks which she has had which is separate from her abdominal pain. The patient denies any lower extremity numbness or weakness or tingling. She denies any fevers or chills. In regards to her abdominal pain, she denies nausea, vomiting, or diarrhea. The patient denies fevers or chills. She has chronic shortness of breath with chronic respiratory failure and oxygen-dependent chronic obstructive pulmonary disease and her shortness of breath has not recently worsened. The patient denies chest pain. Otherwise, she is without specific complaints at this time. She has a history of anemia requiring blood transfusion. She denies dizziness or lightheadedness at this time. She came to the ER for further evaluation of her symptoms and was noted to have hemoglobin worse than her baseline. Most recent hemoglobin check was 10.4 and today it is 8.7. She has strongly heme-positive stool noted on digital rectal exam. Thereafter, hospitalist services were contacted for further evaluation and for hospital admission. Otherwise, the patient is without any specific complaints at this time.    PAST SURGICAL HISTORY:  1. Bilateral knee replacements. 2. Lumpectomy involving her breast. 3. Cholecystectomy. 4. Hysterectomy. 5. Bilateral cataract surgery.  6. Lip  repair.    PAST MEDICAL HISTORY:  1. Hypertension.  2. Hyperlipidemia.  3. Type 2 diabetes mellitus.  4. Breast cancer status post lumpectomy and radiation therapy. 5. Glaucoma.  6. Anemia of chronic disease.  7. History of atrial fibrillation.  8. History of chronic respiratory failure with oxygen-dependent chronic obstructive pulmonary disease. 9. Oxygen-dependent chronic obstructive pulmonary disease. 10. Chronic right-sided congestive heart failure with normal LVEF per echocardiogram 12/02/2010 with mild mitral regurgitation, tricuspid regurgitation and mild pulmonary hypertension.  11. History of aortic regurgitation.  12. History of chronic gastrointestinal bleed with arteriovenous malformation.  13. Hospitalization February 2013 with severe anemia in the setting of presumed gastrointestinal bleeding and arteriovenous malformations. 14. Rheumatoid arthritis. 15. Constipation. 16. Depression.  17. Diverticulosis.   ALLERGIES: Remicade.   MEDICATIONS:  1. Oxygen at 2 liters per minute via nasal cannula continuously.  2. Oxycodone/acetaminophen 5/325 mg 1 tab p.o. every six hours p.r.n. pain.  3. Ascorbic acid 500 mg p.o. twice a day. 4. Atorvastatin 20 mg p.o. daily. 5. Coreg 25 mg twice a day. 6. Cholecalciferol 1000 international units daily.  7. Diltiazem 240 mg p.o. daily.  8. Docusate sodium 100 mg p.o. twice a day. 9. Eldertonic oral liquid 30 mL p.o. twice a day. 10. Iron sulfate 325 mg p.o. three times daily. 11. Advair Diskus 500/50 one puff inhaled twice a day. 12. Folic acid 0.4 mg daily.  13. Lasix 20 mg p.o. daily.  14. Hydralazine 25 mg p.o. three times daily.  15. Hydroxychloroquine 200 mg p.o. twice a day.  16. Lasix 20 mg daily. 17. Hydralazine 25 mg p.o. three times daily. 18. Hydrochloroquine 200 mg p.o. twice a day.  19. Latanoprost 0.005% ophthalmic  solution one drop to both eyes daily.  20. Lisinopril 40 mg daily. 21. Omeprazole 20 mg daily.   22. Phenazopyridine 100 mg p.o. three times daily. 23. Polyethylene glycol 17 grams p.o. daily.  24. Potassium chloride 20 mEq daily.  25. Prednisone 5 mg daily. 26. Phenergan 25 mg p.o. every 6 hours p.r.n. nausea and vomiting.  27. Senna one tablet p.o. p.r.n. constipation. 28. Tramadol 50 mg p.o. every six hours p.r.n. pain. 29. Travoprost 0.004% ophthalmic solution one drop to affected eye daily.   FAMILY HISTORY: Mother died at age 16 secondary to cerebrovascular accident.   SOCIAL HISTORY: Tobacco - none currently. She quit smoking in 1995. She was smoking for multiple years in the past, unable to quantify. Alcohol - none. Illicit drugs - none. The patient is widowed. She is a resident of a rehab facility at the [facility] in [place], [place].   REVIEW OF SYSTEMS: CONSTITUTIONAL: Reports abdominal pain. Denies fevers, chills, or recent changes in weight or weakness. Has some back pain as well. HEAD/EYES: Denies headache or blurry vision. ENT: Denies tinnitus, earache, nasal discharge, or sore throat. RESPIRATORY: Reports chronic shortness of breath, which has not recently worsened. Denies cough or wheezing. CARDIOVASCULAR: Denies chest pain, heart palpitations, or lower extremity edema. GASTROINTESTINAL: Has some abdominal pain and dark/black stools. Denies vomiting, diarrhea, or hematochezia. Reports melena. GU: Denies history of hematuria. ENDOCRINE: Denies heat or cold intolerance. HEME/LYMPH: Denies easy bruising. Has some dark black stool. INTEGUMENTARY: Denies rash or lesions. MUSCULOSKELETAL: Reports back pain. Denies muscle weakness. NEUROLOGIC: Denies headache, numbness, weakness, tingling, or dysarthria. PSYCHIATRIC: Has underlying depression. Denies suicidal or homicidal ideation.      PHYSICAL EXAMINATION:   VITAL SIGNS: Temperature 97.6, pulse 75, blood pressure 178/84, respirations 18, and oxygen saturation 94% on 2 liters nasal cannula, which is her current  baseline oxygen requirement.  GENERAL: The patient is alert and oriented, in mild distress secondary to abdominal pain.  HEENT: Normocephalic, atraumatic. Pupils are equally round and reactive to light. Extraocular movements are intact. Anicteric sclerae. Bilateral conjunctival pallor. Hearing intact to voice. Nares without drainage. Oral mucosa moist without lesions.   NECK: Supple with full range of motion. No JVD, lymphadenopathy, or carotid bruits bilaterally. No thyromegaly or tenderness to palpation over thyroid gland.   LUNGS: Normal respiratory effort without use of accessory respiratory muscles. The patient has decreased breath sounds bilaterally with hyperresonance to percussion without crackles, rales, wheezing, or rhonchi.   HEART: S1 and S2 positive but distant. Regular rate and rhythm. No murmurs, rubs, or gallops. PMI is non-lateralized.   ABDOMEN: Soft and nondistended. She has moderate epigastric tenderness to palpation with voluntary guarding. No rebound. Normoactive bowel sounds. No hepatosplenomegaly or palpable masses. No hernia.   EXTREMITIES: No clubbing, cyanosis, or edema. Pedal pulses are palpable bilaterally.   SKIN: No suspicious rashes. Skin turgor is good.   LYMPH: No cervical lymphadenopathy.   NEUROLOGIC: Alert and oriented x3. Cranial nerves II through XII grossly intact. No focal deficits.   PSYCH: Anxious-appearing female, otherwise appropriate affect.  LABS/STUDIES: Urinalysis: Cloudy urine with specific gravity 1.015, positive nitrite with 2+ leukocyte esterase, 31 WBCs, and 1+ bacteria.   CBC: WBC 8.6, hemoglobin 8.7, hematocrit 28.9, and platelets 215. When previously checked her hemoglobin was 10.4 from 08/28/2011. MCV is 91 today.  Complete metabolic panel: Sodium 142, potassium 4.4, chloride 103, bicarbonate 32, BUN 24, creatinine 0.87, glucose 162, calcium 8.5, anion gap 7, total protein 7.3, albumin 2.7, total bilirubin  0.3, AST 42, ALT 58, and  alkaline phosphatase 128.   Troponin 0.02 with CK 47 and CK-MB 1.7.   EKG reveals normal sinus rhythm, heart rate 90 beats per minute with PACs and left ventricular hypertrophy, nonspecific ST and T wave changes. No acute ischemic changes noted.   Noncontrast CT of the abdomen and pelvis is pending at this time and will be followed.   ASSESSMENT AND PLAN: An 81 year old female with extensive past medical history as listed above here with severe abdominal pain and melena and noted to have heme positive stools in the ER and hemoglobin worse than her baseline with: 1. Suspected gastrointestinal bleed with heme positive stools and melena - suspect possible bleeding from her known AVMs and recommend hospital admission for further evaluation and management. We will admit the patient to Med/Surg unit. We will followup on the abdominal CT which was ordered in the ER. In the meanwhile, we will start the patient on IV fluids for volume resuscitation purposes and also keep her on IV PPI therapy with Protonix for now. We will provide pain control. Obtain serial hemoglobin and hematocrit checks and transfuse as needed and obtain Gastroenterology consultation for further recommendations. Further work-up and management to follow depending on the patient's hospital course.  2. Acute (posthemorrhagic) on chronic anemia - we will follow her hemoglobin and hematocrit closely. As above we will consider transfusion if she becomes symptomatic or has hemodynamic instability neither of which she has at this point. We check a baseline iron panel and ferritin level. Continue iron supplementation for now and monitor hemoglobin and hematocrit closely.  3. Urinary tract infection - the patient has an abnormal urinalysis. We will obtain urine culture and start empiric IV antibiotics in the form of Rocephin. However, she is currently afebrile and without leukocytosis. 4. Chronic respiratory failure - continue supplemental oxygen at  her baseline and titrate to keep her oxygen saturations greater than 90% and manage her chronic obstructive pulmonary disease as below. 5. Oxygen-dependent chronic obstructive pulmonary disease - stable and without acute exacerbation. Continue oxygen at her baseline and continue Advair and write for p.r.n. albuterol metered dose inhaler and p.r.n. DuoNebs. She states that her shortness of breath has been stable and has not recently worsened.  6. Chronic right-sided congestive heart failure - no evidence of decompensated congestive heart failure at this time. She has normal LVEF per most recent echocardiogram. We will hold Lasix while she is being hydrated and getting IV fluids, and we will continue Coreg and ACE inhibitor therapy.  7. History of atrial fibrillation - the patient is currently in normal sinus rhythm. We will continue heart rate control with Cardizem and Coreg therapy. She is not on any antiplatelets or anticoagulants at baseline and would not give any at this time given concerns for GI bleed and anemia. Defer further management to the patient's cardiologist.  8. Hypertension - we will keep the patient on Coreg, hydralazine, Cardizem, and lisinopril for blood pressure control and also write for p.r.n. IV labetalol and monitor blood pressure closely.  9. Hyperlipidemia - continue statin therapy. Check fasting lipid profile in the a.m.  10. Type 2 diabetes mellitus - appears to be diet controlled. We will check Hemoglobin A1c and keep the patient on sliding scale insulin while hospitalized and monitor sugars closely. 11. Rheumatoid arthritis - continue Plaquenil and prednisone. She is at risk for steroid induced ulcers while on chronic steroid therapy and she now has melena and worsening anemia with abdominal  pain and this could be from known AVMs versus possible gastritis or ulcer disease and she will be on PPI therapy for now and otherwise she can continue to follow up with her rheumatologist  and she denies any significant arthralgias at this time.  12. DVT prophylaxis - SCDs and TEDs.  CODE STATUS: FULL CODE.   TIME SPENT ON ADMISSION: Approximately 60 minutes. ____________________________ Elon AlasKamran N. Cleota Pellerito, MD knl:slb D: 10/02/2011 21:34:45 ET    T: 10/03/2011 07:33:14 ET        JOB#: [id number] cc: Elon AlasKamran N. Arielis Leonhart, MD, <Dictator> Abbey ChattersLloyd K. Jorene Guestomstock, MD Elon AlasKAMRAN N Acie Custis MD ELECTRONICALLY SIGNED 10/20/2011 19:38

## 2014-09-03 NOTE — Consult Note (Signed)
VSS afebrile, hgb up to 9,9, abd not tender.  She says most of her pain is in her back. No further GI recommendations.  Electronic Signatures: Scot JunElliott, Olivette Beckmann T (MD)  (Signed on 01-Apr-13 18:09)  Authored  Last Updated: 01-Apr-13 18:09 by Scot JunElliott, Tawnie Ehresman T (MD)

## 2014-09-03 NOTE — H&P (Signed)
PATIENT NAME:  Tamara Harmon, Tamara Harmon MR#:  [id number] DATE OF BIRTH:  1923-03-10  DATE OF ADMISSION:  05/31/2011  PRIMARY CARE PHYSICIAN Dr. Jorene Guest   REASON FOR ADMISSION: Told that hemoglobin was low and sent in to the hospital.   HISTORY OF PRESENT ILLNESS: This is an 81 year old female with history of chronic anemia. She does follow up with Dr. Orlie Dakin for this, sometimes she receives blood, sometimes Procrit and sometimes iron infusions. She does take iron on a daily basis. She does not see any bright red blood in the stool and she does have black stools and she is taking iron. She did have some diarrhea the other day but that has resolved. She does take aspirin and omeprazole on a daily basis. Back in November she was in a hospital in [place] and had an endoscopy at that time, results unknown. She does complain of fatigue and shortness of breath with exertion. Does complain of some back pain and swelling of the legs and arthritis in the hands. In the ER she was found to have a hemoglobin of 6.7 and hospitalist services were contacted for further evaluation.   PAST MEDICAL HISTORY:  1. History of breast cancer with lumpectomy, radiation treatment. 2. Rheumatoid arthritis. 3. Glaucoma.  4. Hyperlipidemia.  5. Anemia.  6. Diabetes.  7. Atrial fibrillation. 8. Chronic obstructive pulmonary disease. 9. Aortic regurgitation. 10. AV malformations.   PAST SURGICAL HISTORY:  1. Bilateral knee replacement. 2. Lumpectomy. 3. Cholecystectomy.  4. Hysterectomy.  5. Cataracts.  6. Hip repair.   ALLERGIES: No known drug allergies.   SOCIAL HISTORY: Lives at [facility] for the past two months. Quit smoking in 2003. No alcohol. No drug use.   FAMILY HISTORY: Father died of old age in his 72s. Mother died at 67 of a cerebrovascular accident. Siblings are dead. Cannot tell me what they died of.   MEDICATIONS:  1. MED Pass 2.0 b.i.d.  2. Colace 100 mg at bedtime.  3. Travoprost one drop right  eye at bedtime.  4. Lipitor 20 mg at bedtime.  5. Ferrous sulfate 325 mg t.i.d.  6. Hydralazine 25 mg 3 tablets, so a total of 75 mg t.i.d.  7. Calcium carbonate 1 tablet b.i.d.  8. Vitamin C 500 mg twice a day. 9. Aspirin 81 mg daily.  10. Advair Diskus 500/50 twice a day. 11. Coreg 25 mg twice a day. 12. Omeprazole 40 mg daily.  13. Lisinopril 40 mg daily.  14. Diltiazem 240 mg daily.  15. Folic acid 1 mg daily.  16. Lasix 40 mg daily.  17. Potassium 20 mEq daily.  18. Latanoprost 0.005% one drop left eye daily.   REVIEW OF SYSTEMS: CONSTITUTIONAL: Positive for chills. No fever, no sweats. Positive for weight loss 30 to 40 pounds over the past 4 or 5 years. Positive for weakness and fatigue. EYES: She does wear glasses. ENT: Positive for runny nose. No sore throat. No difficulty swallowing. CARDIOVASCULAR: No chest pain. No palpitations. RESPIRATORY: Positive for shortness of breath, clear phlegm. No hemoptysis. GASTROINTESTINAL: Positive for abdominal pain and back pain more on the right side, occasional diarrhea. Positive for black stools. GENITOURINARY: No burning on urination. No hematuria. MUSCULOSKELETAL: Positive for arthritis pain. PSYCHIATRIC: Positive for depression. ENDOCRINE: No thyroid problems. HEMATOLOGIC/LYMPHATIC: Positive for anemia. NEUROLOGIC: No fainting or blackouts.   PHYSICAL EXAMINATION:  VITAL SIGNS: Temperature 97.6, pulse 106 but converted to normal sinus rhythm in the 80s, respirations 20, blood pressure 122/71, pulse oximetry 100% on oxygen.  GENERAL: No respiratory distress.   EYES: Conjunctivae pale. Lids normal. Pupils equal, round, and reactive to light. Extraocular muscles intact. No nystagmus.  ENT: Tympanic membranes obscured by wax. Nasal mucosa no erythema. Throat no erythema. No exudate seen. Lips and gums no lesions. Throat no exudates. No lesions seen.   NECK: No JVD. No bruits. No lymphadenopathy. No thyromegaly. No thyroid nodules palpated.    RESPIRATORY: Lungs clear to auscultation. No use of accessory muscles to breathe. No rhonchi, rales, or wheeze heard.   CARDIOVASCULAR: S1, S2 normal. +2/6 systolic ejection murmur. Currently in normal sinus rhythm. Carotid upstroke 2+ bilaterally. No bruits.   EXTREMITIES: Dorsalis pedis pulses 1+ bilaterally, 3+ edema bilateral lower extremity.   ABDOMEN: Soft, slight tenderness in the right flank. No organomegaly/splenomegaly. Normoactive bowel sounds. No masses felt.   LYMPHATIC: No lymph nodes in the neck.   MUSCULOSKELETAL: 3+ edema. No clubbing. No cyanosis.   SKIN: On the left second toe and right third and fourth toes small little hammertoe ulcers. No signs of infection.   NEUROLOGIC: Cranial nerves II through XII grossly intact. Deep tendon reflexes half plus bilateral lower extremities.   PSYCHIATRIC: Patient is oriented to person, place, and time.   LABORATORY, DIAGNOSTIC, AND RADIOLOGICAL DATA: Glucose 107, BUN 15, creatinine 0.61, sodium 145, potassium 4.1, chloride 106, CO2 31, calcium 8.5. Liver function tests normal except for albumin low at 2.2. White blood cell count 8.1, hemoglobin and hematocrit 6.7 and 21.0, platelet count 384. Troponin negative. EKG showed atrial fibrillation, 132 beats per minute, rapid ventricular response.   ASSESSMENT AND PLAN:  1. Acute on chronic anemia, probable slow GI bleed. Will transfuse 2 units of packed red blood cells. Benefits and risks of transfusion were explained to the patient. Patient understands and is willing to proceed with the transfusion. Will stop aspirin. Give IV Protonix 40 mg IV b.i.d. Put on clear liquid diet for right now. Very gentle IV fluids secondary to age and history of heart failure. Hold the fluids while giving blood. Will get a GI consult with Dr. Mechele CollinElliott.  2. Rapid atrial fibrillation converted to normal sinus rhythm. Will continue the oral Cardizem and Coreg and put on telemetry.  3. Hypertension. Blood  pressure currently stable. Will put holding parameters on the hydralazine and lisinopril but need to continue the Coreg and Cardizem. 4. Hyperlipidemia. Continue Lipitor.  5. Chronic obstructive pulmonary disease on DuoNeb and Advair, respiratory status stable.  6. History of coronary artery disease. Hold aspirin.  7. Diabetes. Seems like it is diet controlled.  8. Rheumatoid arthritis. Looks like the Plaquenil was stopped.  9. Glaucoma. Continue the eye drops.  10. History of breast cancer.   TIME SPENT ON ADMISSION: 60 minutes.   CODE STATUS: Patient is a FULL CODE.   ____________________________ Herschell Dimesichard J. Renae GlossWieting, MD rjw:cms D: 05/31/2011 08:57:50 ET T: 05/31/2011 09:20:53 ET JOB#: [id number]  cc: Herschell Dimesichard J. Renae GlossWieting, MD, <Dictator> Dr. Lise Aueromstock  Ryelee Albee J Keyasia Jolliff MD ELECTRONICALLY SIGNED 06/01/2011 13:39

## 2014-09-03 NOTE — Consult Note (Signed)
Brief Consult Note: Diagnosis: Acute on chronic anemia with melena (on oral iron)  and heme positive. Hx of recurrent GI blood loss, anemia of chroic dx.   Patient was seen by consultant.   Consult note dictated.   Comments: No specific GI c/o except gradual chronic weight loss; appetite is fair-baseline in nursing home. Reports she eats well when the food is good-avoids nuts/seeds.  [facility]will [facility]. Recent admission last month here for the same. Cough for a couple weeks and she has significant rhonchi/crackles/cough currently that would prohibit elective sedation and luminal evaluation. Would recommend medical management and consult with Dr. Orlie DakinFinnegan who follows her anemia.  Electronic Signatures: Rowan BlaseMills, Jillana Selph Ann (NP)  (Signed [id number]-Feb-13 12:09)  Authored: Brief Consult Note   Last Updated: 05-Feb-13 12:09 by Rowan BlaseMills, Suhailah Kwan Ann (NP)

## 2014-09-03 NOTE — Consult Note (Signed)
Pt had colonoscopy and upper endo in [place] in Nov and Dec 2012 therefore given her over all health I do not recommned repeating these but transfuse as needed and give oral iron and iv iron as needed.   Electronic Signatures: Scot JunElliott, Robert T (MD)  (Signed on 21-Jan-13 12:38)  Authored  Last Updated: 21-Jan-13 12:38 by Scot JunElliott, Robert T (MD)

## 2014-09-03 NOTE — Consult Note (Signed)
Brief Consult Note: Diagnosis: CHRONIC ANEMIA, OF CHRONIC DISEASE AND BLOOD LOSS.   Patient was seen by consultant.   Comments: SEE DICTATED NOTE TO FOLLOW. PATIENT FOLLOWED IN THE PAST IN [facility]CER CENTER BY DR Orlie DakinFINNEGAN, LAST SEEEN IN 06/2011. HAS CHRONIC TRANSFUSION REQUIREMENT DUE TO CHRONIC GUI BLEEDING. WOULD CONTINUE TO OFFER CANCER CENTER F/U, FOR PURPOSE OF TRANSFUSE PRN IN CLINIC, AT INTERVALS, TO AVOID HOSPITALIZATIONS. CURRENTLY NO INDICATION FOR IV IRON OR PROCRIT. NOTHING TO OFFER ACUTELY FROM HEME/ONC.  Electronic Signatures: Marin RobertsGittin, Kindall Swaby G (MD)  (Signed [id number]-May-13 15:10)  Authored: Brief Consult Note   Last Updated: 25-May-13 15:10 by Marin RobertsGittin, Zetha Kuhar G (MD)

## 2014-09-03 NOTE — Consult Note (Signed)
PATIENT NAME:  JANIN, KOZLOWSKI MR#:  [id number] DATE OF BIRTH:  11/08/1922  DATE OF CONSULTATION:  06/17/2011  REFERRING PHYSICIAN:   Larena Glassman, MD  CONSULTING PHYSICIAN:  Lynnae Prude, MD/Kamisha Ell A. Arvilla Market, ANP  REASON FOR CONSULTATION: Gastrointestinal bleed.   HISTORY OF PRESENT ILLNESS: This 81 year old patient of Dr. Jorene Guest was admitted to the hospital yesterday for acute on chronic anemia. The patient was recently discharged  06/03/2011 for anemia, likely from chronic GI blood loss and was evaluated by Dr. Mechele Collin. The patient had recent luminal evaluation done in [place] in December and decision was to hold off on repeat scopes last month. The patient received 2 units of packed red blood cells for hemoglobin of 6.7. She had no evidence of active GI bleed. She did have atrial fibrillation with a rapid ventricular rate thought due to her anemia causing stress on the heart. Heart rate improved and she was felt stable to be discharged back to her nursing home facility. The patient has been eating a regular diet, but avoids nuts and seeds. She does present on ascorbic acid, potassium supplement, and low-dose aspirin for known cardiovascular disease. She has noted no abdominal pain, nausea or vomiting, heartburn, or frank rectal bleeding. After blood transfusion last month her admission hemoglobin was improved from 6.7 to 9.4 and discharged at 8.4. The patient was readmitted yesterday at hemoglobin 6.6. She had black stools on oral iron therapy that were heme-positive. Gastroenterology has been asked to see the patient regarding further evaluation and management.   PAST MEDICAL HISTORY:  1. Diabetes mellitus.  2. Cerebrovascular accident.  3. Depression.  4. Hypertension.  5. Rheumatoid arthritis.  6. History of anemia with AVMs, previous diverticular bleed at [facility] 2007.  7. History of cautery for lesions in the colon.  8. Valvular heart disease.  9. Aortic regurgitation.  10. Atrial  fibrillation.  11. Chronic obstructive pulmonary disease.  12. History of chronic GI bleed.  13. History of persistent anemia.  14. Breast cancer with lumpectomy and radiation therapy 1995.  PAST SURGICAL HISTORY:  1. Bilateral knee replacements.  2. Lumpectomy.  3. Cholecystectomy.  4. Hysterectomy.  5. Cataract repair.  6. Hip repair.   MEDICATIONS ON ADMISSION per nursing home sheet: 1. Tramadol 50 mg every six hours p.r.n. pain.  2. Travatan 0.04% one drop at nighttime right eye.  3. Tylenol Extra Strength 500 mg, 2 capsules twice daily.  4. Vitamin D3 1000 international units once daily.  5. Xalatan 0.05% ophthalmic solution one drop left eye for glaucoma.  6. Advair Diskus 500/50, 1 puff twice daily.  7. Ascorbic acid 500 mg twice daily.  8. Aspirin 81 mg daily.  9. Ativan 0.5 mg every six hours as needed.  10. Carvedilol 25 mg twice daily.  11. Colace 100 mg  twice daily.  12. Diltiazem 240 mg daily.  13. Duo-Nebs 0.5 mg/2.5 mg, 3 mL  4 times daily as needed.  14. Ferrous sulfate 325 mg 3 times daily.  15. Folic acid 1 mg daily.  16. Hydralazine 25 mg, 3 tablets 2 times daily.  17. Hydrochloroquine 200 mg twice daily.  18. Lasix 40 mg daily.  19. Lipitor 20 mg daily.  20. Lisinopril 40 mg daily.  21. MiraLAX powder 17 grams once daily.  22. Omeprazole 40 mg daily.  23. Oxygen 2 liters.  24. Potassium chloride 20 mEq once daily.  25. Senna 8.6 mg q. 8 hours as needed for bowel regimen.   ALLERGIES: Remicade.  REVIEW  OF SYSTEMS: The patient denies any fevers or chills. She has had a chronic cough for several weeks, white sputum. Shortness of breath has been a little bit worse. Denies any recent antibiotic therapy. Denies any problems with heartburn, indigestion, nausea or vomiting. Reports appetite is fair, not as good as she would like. She continues to have slow gradual weight loss. The patient says she eats well when she likes the food, but most of the time she does  not care for what is offered. She reports normal bowel habits, stools are always black. Reports no unusual odor. Rheumatoid arthritis has flared in her hands, presented with right hand swelling. She has had some discomfort in the legs. An ultrasound today was negative for deep vein thrombosis. Remaining 10 systems negative.   PHYSICAL EXAMINATION:  VITAL SIGNS: Temperature 98.9, pulse 74, respirations 18, blood pressure 165/60,  97% on 2 liters nasal cannula.   GENERAL: Elderly, frail African American female resting in bed, semi-Fowler's  position.   HEENT: Head is normocephalic, wearing a wig.  Conjunctivae pale pink. Oral mucosa is moist, intact. No oral bleeding noted.   NECK: Supple.   HEART: Heart tones S1 and S2, slightly irregular, positive murmur.   LUNGS: Lungs have obvious rhonchi, scattered crackles. She is coughing up clear white sputum. Respirations are nonlabored.   ABDOMEN:  Soft, a little protuberant, nontender, normal bowel sounds.   RECTAL: Exam deferred. ER reported black stools, heme-positive. The patient had recent colonoscopy so would not expect rectal mass. She denies bright red blood per rectum.   MUSCULOSKELETAL: Globally weak, able to sit up. Right hand grossly swollen around the MCP with rheumatoid arthritis finger deformity. Left hand also has some mild MCP swelling, not as much as the right. There is no warmth, no significant tenderness to palpation.   SKIN: Warm and dry.    EXTREMITIES: Without lower extremity edema. Wearing automated compression device.   NEUROLOGIC: Cranial nerves II through XII grossly intact. She has an excellent memory. Strength is weak in the hands, able to move about.   PSYCH: Affect and mood is within normal, interacting appropriately.   LABS/STUDIES: Admission blood work 06/16/2011 with glucose 131, BUN 16, creatinine 0.85, electrolytes unremarkable. Iron 29, TIBC is 220, calcium is 8.3, ferritin 41, LDH 209, lipase is 69, albumin  is low at 2.1. Liver panel otherwise unremarkable. Troponin negative. WBC is 9.6, hemoglobin 6.6, hematocrit 20.6, platelet count 369, MCV 89, MCH 38.5, RDW 16.3. Retic count is elevated at 3.2, absolute retic count 0.074. ProTime is 14.7, INR 1.1. Urinalysis is unremarkable except for positive hyaline casts. B12 65. Erythropoietin is 101. Haptoglobin 294, transferrin 182.   Repeat laboratory studies 06/17/2011 with BUN 13, creatinine 0.67. Hemoglobin A1c 5.5, TSH 0.868. Hemoglobin 7.9, hematocrit 24.3 after first unit PRBC transfusion.   RADIOLOGY: Ultrasound of lower extremity bilateral for edema showed no evidence of deep vein thrombosis on either side. Chest x-ray, single view 06/16/2011 showed mild right basilar opacities, could be related to atelectasis from the right hemidiaphragm elevation. However, infection or aspiration are not excluded. Follow-up PA and lateral chest radiographs are recommended.   Outside records reviewed from [facility] 04/16/2011 through 04/21/2011. There is mention of colonoscopy and EGD with pathology findings but not the actual luminal evaluation report. She had an adenomatous colon polyp removed and a mild chronic duodenitis reported.  No evidence of celiac disease.   A CT scan of the abdomen and pelvis without contrast was performed 04/07/2011  showing minimal probable atelectasis or pneumonia at the right lung base. Suggested minimal left hydronephrosis, although obstructing stone not identified. Extensive atherosclerotic calcification. No evidence of bowel obstruction. There was a suggestion of luminal evaluation in November and will need to request records. She had scopes done in [facility]. See Dr. Earnest Conroy last note, and most recently through this facility in 2008. The patient did undergo a capsule endoscopy 08/07/2005 by Dr. Mechele Collin with findings of normal small bowel study. There was recommendation in 2009 to consider repeat capsule and I  do not have records of that being done. We will need to inquire at outside facility.     IMPRESSION:  1. This unfortunate, elderly female has a history of recurrent problems with gastrointestinal blood loss thought to be due to arteriovenous malformations. She has acute on chronic anemia once again after one month hospitalization. The patient also has multiple chronic medical problems that contribute to anemia of chronic disease. She reports she did much better when she was following with Dr. Orlie Dakin on a regular basis and missed his appointments last year because of her hip fracture and surgery. She says she has just not been able to keep her hemoglobin stable since last year. The patient has no abdominal complaints or tenderness at this time, has noted no fresh or active maroon or bright red blood. BUN is normal. She reports steady gradual weight loss. She does have a personal history of breast cancer, history of diabetes, chronic obstructive pulmonary disease, aortic valve disease, atrial fibrillation, and significant rheumatoid arthritis. Dr. Mechele Collin has suggested that the patient was not able to recycle her iron normally due to medical problems. Whether she actually has an active bleed at this time is a possibility since she is heme-positive again. She has shown no overt signs of an aggressive gastrointestinal bleed.  2. The patient presents with a chronic cough, received 1 unit PRBC and has significant adventitious breath sounds today. She has had dyspnea on exertion. The cough she reports has been chronic. Lasix is ordered between units of blood. She is oxygenating adequately, but would not believe she is a sedation candidate at this time.  3. Recent scopes have been done a couple of months ago at an outside facility and we need to have records. We will request again specifically for the colonoscopy, EGD records from December and November and inquire whether she has had a recent capsule endoscopy. The  patient is too weak at this time to go through with a full bowel prep. Once her lungs are stable in a few days we could consider repeat capsule study and this can be accomplished with 2 quarts of MiraLAX, which is a less aggressive bowel prep than a full colonoscopy requires.  4. The patient is on vitamin C, potassium, and low-dose aspirin. I wonder if she could come off of the vitamin C. With her cardiovascular history it is probably necessary for her to remain on her low-dose aspirin. She was on potassium with Lasix.   The case was discussed with Dr. Mechele Collin in collaboration of care. Further GI recommendations pending patient progress and review of old records.             These services provided by Amedeo Kinsman, ANP in collaborative agreement with Dr. Lynnae Prude.   ____________________________ Ranae Plumber. Arvilla Market, ANP kam:bjt D: 06/17/2011 13:20:39 ET T: 06/17/2011 13:48:34 ET JOB#: [id number]  cc: Cala Bradford A. Arvilla Market, ANP, <Dictator> Ranae Plumber. Suzette Battiest, MSN, ANP-BC Adult Nurse Practitioner  ELECTRONICALLY SIGNED 06/20/2011 12:18

## 2014-09-03 NOTE — Consult Note (Signed)
PATIENT NAME:  Tamara Harmon, FLEECE MR#:  [id number] DATE OF BIRTH:  02/02/1923  DATE OF CONSULTATION:  10/04/2011  REFERRING PHYSICIAN:   CONSULTING PHYSICIAN:  Knute Neu. Lorre Nick, MD  HISTORY OF PRESENT ILLNESS: Tamara Harmon is an 81 year old patient who was admitted on 05/23. I was asked to see and evaluate the patient and saw her on that day, reviewed the chart including admission note and previous [facility] records and placed a note on the chart on that day with this full dictation rendered being delayed until this time. This patient has a history of chronic GI bleed, AV malformations. Presented with severe abdominal pain and associated with melena and dark stools over two weeks. Her hemoglobin is 8.7 and the prior recent hemoglobin was 10.4. She had strong heme positive stools on rectal exam and was admitted. She has shortness of breath chronically with chronic respiratory failure and oxygen dependent chronic obstructive pulmonary disease which apparently had not worsened. Patient has a past history significant for multiple issues; see below. She also was hospitalized as recently as this February with severe anemia with GI bleeding. There also was an admission in late March/early April of this year with back pain that resolved, was attributed to constipation versus musculoskeletal. Patient seen by hematology in the past and followed by Dr. Orlie Dakin, last seen in February, prior to that seen in October, she missed a number of appointments but she was seen in the [facility] for the purpose of monitoring chronic anemia and transfusing p.r.n. to try to keep her out of the hospital. Also for the occasional Procrit requirement and last seen on 02/05 of this year.   PAST MEDICAL HISTORY:  1. Recent probable pneumonia or bronchitis. 2. Recurrent GI bleed with AV malformation. 3. Breast cancer with prior lumpectomy and radiation. 4. Rheumatoid arthritis. 5. Glaucoma.  6. Hyperlipidemia.   7. Diabetes. 8. Atrial fibrillation. 9. Chronic pulmonary disease with oxygen. 10. Aortic regurgitation. 11. Bilateral knee replacements.  12. Cholecystectomy.  13. Hysterectomy.  14. Cataracts. 15. Hip surgery.  16. Hernia repair.   FAMILY HISTORY: Noncontributory. No anemia or cancer.   SOCIAL HISTORY: No alcohol or tobacco. Smoking negative as stated but she did smoke and quit in 1995. Patient lives at [facility] in [place].   ALLERGIES: Remicade.   MEDICATIONS: Multiple medications at the time of admission were listed on the admit note and reviewed from that note are:  1. 2 liters of oxygen. 2. Percocet 5/325 q.6 hours p.r.n. for pain. 3. Ascorbic acid 500 b.i.d.  4. Atorvastatin 20 daily.  5. Coreg 25 twice a day. 6. Cholecalciferol 1000 units daily. 7. Diltiazem 240 mg daily.  8. Docusate 100 twice a day. 9. Eldertonic twice a day. 10. Iron sulfate p.o. t.i.d.  11. Advair Diskus 500/50, 1 twice a day. 12. Folic acid 400 mcg daily.  13. Lasix 20 mg daily.  14. Hydralazine 25 mg 3 times a day.  15. Hydrochloroquine 200 mg twice a day. 16. Lasix 20 mg daily.  17. Hydralazine 25 mg 3 times a day. 18. Latanoprost ophthalmic solution each eye daily.  19. Lisinopril 40 mg daily.  20. Omeprazole 20 mg daily. 21. Phenazopyridine 100 mg 3 times a day. 22. MiraLax 17 grams daily.  23. Potassium 20 mEq daily.  24. Prednisone 5 mg daily.  25. Phenergan 25 mg q.6 hours p.r.n. 26. Senna once daily. 27. Tramadol 50 mg q.6 hours p.r.n.  28. Travoprost ophthalmic solution one drop each eye.   PHYSICAL  EXAMINATION:  GENERAL: Patient was alert and cooperative. Was not having abdominal pain when I saw her.   HEENT: There was no jaundice of the sclerae. There was no thrush.   NECK: There was no mass in the neck.   LYMPHATIC: There are no palpable lymph nodes in neck, supraclavicular, submandibular, axilla.    LUNGS: Clear with decreased air entry but no wheezing.    HEART: Regular.   ABDOMEN: Soft, nondistended, and not tender. There was noted epigastric tenderness to palpation on admission.   EXTREMITIES: No edema.  SKIN: There was no rash.  PSYCH: She was alert and cooperative, oriented.  LABORATORY, DIAGNOSTIC AND RADIOLOGICAL DATA: On admission white count 8.6, platelets 216, hemoglobin 8.7, MCV 91, creatinine 0.87, SGOT was minimally elevated at 42. Troponin was not elevated. Iron 29% saturated and ferritin 102. Urinalysis showed 1+ bacteria and 2+ esterase and 31 white cells. CT of the abdomen and pelvis without contrast showed some prominence of the left renal collecting system but no definite stones and degenerative joint disease in the spine and emphysema in the lower lung zones. Chest x-ray showed chronic elevation of the right hemidiaphragm. No acute findings. The urine later showed Escherichia coli and Providencia and the follow up hemoglobin 05/24 was 7.5. The platelets remained normal. The hemoglobin lowest recorded was then 7.8.   IMPRESSION AND PLAN: Patient has chronic transfusion requirement due to chronic GI bleeding. She has multiple medical problems that are being addressed including treating a urine infection deferred to primary care and treating chronic obstructive pulmonary disease. Serum iron studies were unremarkable and patient is on p.o. iron. There is currently no indication for IV iron. Currently no indication for Procrit as the patient needs active transfusion support. It is not clear how much chronic anemia is versus blood loss anemia. There is no other suspicion of other hematologic problem. There is nothing to offer acutely from hem/onc. Would recommend that at the time of discharge it is reasonable to continue to offer Cancer Center follow up within a few weeks of discharge for the purpose of following the hemoglobin and the iron and offering Procrit p.r.n. if hemoglobin drifts down slowly due to chronic anemia or transfusions  in the clinic p.r.n. in order to avoid hospitalization for profound anemia later.   ____________________________ Knute Neuobert G. Lorre NickGittin, MD rgg:cms D: 10/05/2011 17:00:00 ET T: 10/06/2011 08:13:53 ET JOB#: [id number]  cc: Knute Neuobert G. Lorre NickGittin, MD, <Dictator> Marin RobertsOBERT G GITTIN MD ELECTRONICALLY SIGNED 10/26/2011 12:04

## 2014-09-03 NOTE — Consult Note (Signed)
Pt seen, see consult from NP, also mine from 3 weeks ago.  Chronic GI blood loss, recent colon and [place] in [place] in late Nov or early Dec.  High risk for any medicated procedures, consider capsule endoscopy, may do EGD.  Need full text from recent procedures.  Exam now has audible upper air way congestion on expiration. check BNP.  CXR ok.  Electronic Signatures: Scot JunElliott, Xian Apostol T (MD)  (Signed on 05-Feb-13 15:18)  Authored  Last Updated: 05-Feb-13 15:18 by Scot JunElliott, Julina Altmann T (MD)

## 2014-09-03 NOTE — H&P (Signed)
PATIENT NAME:  Tamara Harmon, Tamara Harmon MR#:  [id number] DATE OF BIRTH:  [birth year]  DATE OF ADMISSION:  08/09/2011  REFERRING PHYSICIAN: Dr. Enedina Finner PRIMARY CARE PHYSICIAN: Dr. Jorene Guest  PRESENTING COMPLAINT: Patient presenting from [facility] with complaints of abdominal pain, back pain.   HISTORY OF PRESENT ILLNESS: Ms. Slaydon is a pleasant 81 year old woman with history of breast cancer status post lumpectomy and radiation, hypertension, hyperlipidemia, diabetes, rheumatoid arthritis, history of GI bleed and AVMs who represents after being evaluated on 08/05/2011 for similar issues of abdominal pain and back pain. Patient was discharged back to nursing facility. She reports that she was recently diagnosed with pneumonia and it looks like based on her MAR has been initiated on antibiotics. She reports for the past two weeks now having abdominal pain in the lower area as well as lower back pain but has been worsening in the past one week and again on 03/26 presented with increased severity in her abdominal pain to the point where she is not able to sleep and with recurrence this evening. She denies any nausea, vomiting, diarrhea. No fevers or chills. She actually reports constipation. No anorexia. Denies any chest pain. No worsening shortness of breath or syncope. Reports that her lower extremity edema is chronic and actually is better from her baseline. She typically uses wheelchair since her hip repair but intermittently ambulates with walker.   PAST MEDICAL HISTORY:  1. Last admitted February 2013 with severe anemia in the setting of presumed gastrointestinal bleed and AVMs.  2. Breast cancer status post lumpectomy and radiation therapy.  3. Hypertension.  4. Glaucoma.  5. Hyperlipidemia.  6. History of anemia of chronic disease.  7. Diabetes.  8. Atrial fibrillation.  9. Chronic obstructive pulmonary disease on chronic oxygen of 2 liters.  10. Aortic regurgitation.  11. Chronic GI bleed and AV  malformations.  12. Congestive heart failure, unspecified.  13. Rheumatoid arthritis.  14. Constipation.  15. Depression.  16. Diverticulosis.   PAST SURGICAL HISTORY:  1. Bilateral knee replacement.  2. Lumpectomy.  3. Cholecystectomy.  4. Hysterectomy.  5. Bilateral cataract surgery.  6. Left hip repair.   ALLERGIES: Remicade.   MEDICATIONS:  1. Prednisone 5 mg daily.  2. Latanoprost one drop both eyes at bedtime.  3. Omeprazole 40 mg daily.  4. Lisinopril 40 mg daily.  5. Diltiazem extended release 240 mg daily.  6. Furosemide 40 mg daily.  7. Atorvastatin 20 mg daily.  8. Folic acid 1 mg daily.  9. Vitamin D3 1000 international units daily.  10. KCl 20 mEq daily.  11. MiraLax 17 grams daily.  12. Ascorbic acid 500 mg daily.  13. Docusate sodium 100 mg b.i.d.  14. Hydroxychloroquine 200 mg b.i.d.  15. Advair Diskus 500/50 mcg b.i.d.  16. Carvedilol 25 mg b.i.d.  17. Acetaminophen extra strength 1000 mg b.i.d.  18. Ferrous sulfate 325 mg t.i.d.  19. Hydralazine 75 mg t.i.d.  20. Travoprost 0.004% one drop in the right eye at bedtime.  21. Senna every eight hours as needed.  22. Lorazepam 0.5 mg every six hours as needed.  23. DuoNebs q.i.d. as needed.  24. Oxygen 2 liters continuous.  25. Phenergan 25 mg every six hours as needed.  26. It looks like patient was initiated on Levaquin 500 mg daily starting on 03/22 and is to end on 03/31.   SOCIAL HISTORY: She lives at [facility]. No alcohol or drug use. She quit tobacco in 1995. She is widowed.   FAMILY HISTORY:  Mother had stroke and died at [id number].   REVIEW OF SYSTEMS: CONSTITUTIONAL: No fevers, chills, nausea or vomiting. EYES: She has history of glaucoma and cataracts. ENT: No epistaxis or discharge. No dysphagia. RESPIRATORY: Denies any cough, wheezing. No worsening shortness of breath. CARDIOVASCULAR: No chest pain. She has chronic ankle and feet edema. No syncope or palpitations. GASTROINTESTINAL: No nausea,  vomiting. She endorses constipation, abdominal pain as per history of present illness. No hematemesis or melena. GENITOURINARY: No dysuria, hematuria. ENDO: No polyuria or polydipsia. HEME: She has easy bruising. SKIN: No ulcers. MUSCULOSKELETAL: Reports low back pain as per history of present illness. NEUROLOGIC: No history of strokes or seizures. PSYCH: Denies any suicidal ideation.   PHYSICAL EXAMINATION:  VITAL SIGNS: Temperature 97.6, pulse 72, respiratory rate 16, blood pressure 172/76, sating 100% on 2 liters of oxygen.   GENERAL: Thin, lying in bed in no apparent distress.   HEENT: Normocephalic, atraumatic. Pupils equal, symmetric. She has got nonicteric sclerae. Nares with nasal cannula in place. Moist mucous membrane.   NECK: Soft and supple. No adenopathy or JVP.   CARDIOVASCULAR: Non-tachy. She has a 3+ systolic murmur. No rubs or gallops.   LUNGS: Faint basilar crackles. No use of accessory muscles or increased respiratory effort.   ABDOMEN: Soft. Mild tenderness in the lower abdomen. No rebound or guarding. No mass appreciated.   EXTREMITIES: Ankle edema of 2 to 3+. Dorsal pedis pulses 1+.   MUSCULOSKELETAL: No joint effusion.   SKIN: No ulcers.   NEUROLOGIC: Symmetrical strength. No focal deficits.   PSYCH: She is alert and oriented. Patient is cooperative.   LABORATORY, DIAGNOSTIC AND RADIOLOGICAL DATA: Troponin less than 0.02. Urinalysis with specific gravity 1.015, pH 7, RBC 1 per high-power field, WBC 1 per high-power field. WBC 14.4, hemoglobin 8.2, hematocrit 25.9, platelets 373, MCV 86, lipase 92, glucose 169, BUN 19, creatinine 0.78, sodium 143, potassium 4, chloride 102, carbon dioxide 34, calcium 8.8. LFTs within normal limits. Albumin 2.8. CT scan of the chest with patchy opacities in the right lung base and minimal atelectasis at the left lung base. There are trace pleural effusions or pleural thickening. There is mild extrahepatic bile duct dilatation and  moderate intrahepatic ductal dilatation. Pancreatic duct is slightly prominent but otherwise unremarkable. The adrenal glands appear slightly thickened. There is hypodensity in the right kidney and hypodensity in the left kidney with cyst noted. There is mild left hydronephrosis. No definite hydroureter seen. There are multiple calcifications in the lower left pelvis in the course of the distal left ureter therefore cannot exclude a small distal ureteral calculus. Small bowel loops are nondistended. There is a large amount of stool throughout the colon. There is occasional colonic diverticula. No evidence of diverticulitis. There is a 2.2 cm fluid density structure just to the right of the aorta in the retrocrural space. This is of uncertain significance. EKG with sinus rate of 68. T wave inversion in aVR. No ST elevation or depression. There is left ventricular hypertrophy noted.   ASSESSMENT AND PLAN: Ms. Joseph ArtWoods is a pleasant 81 year old woman with history of chronic obstructive pulmonary disease on O2, hypertension, hyperlipidemia, breast cancer, GI bleed, anemia, congestive heart failure, diabetes, diverticulosis, constipation, rheumatoid arthritis, aortic regurgitation presenting with complaints of low abdominal pain and low back pain. 1. Abdominal pain, back pain with recurrence and representation to the Emergency Room, possibly multifactorial with pneumonia, obstipation plus/minus biliary disease, plus/minus calculus, plus/minus chronic GI bleed and AVMs. Continue to cycle hemoglobin. Obtain GI consultation. Will  initiate aggressive bowel regimen. Continue on Levaquin, Advair, oxygen, SVNs as needed. Cycle cardiac enzymes. Continue off unit tele. Her first troponin is negative.  2. Anemia with history of GI bleed and AVMs. As above her presenting hemoglobin is actually stable. Consider transfusion if she has further drop in her hemoglobin.  3. Hypertension. She is hemodynamically stable. Continue her  lisinopril, diltiazem and Coreg.  4. Glaucoma. Resume her eyedrops.  5. Diabetes, not on medications. Continue sliding scale insulin.  6. Hyperlipidemia. Restart her atorvastatin.  7. Chronic obstructive pulmonary disease, stable. Management as above.  8. Congestive heart failure, unspecified. Also stable. Resume her Lasix and carvedilol.  9. Rheumatoid arthritis. Prednisone and hydroxychloroquine.  10. Prophylaxis with TEDs, SCDs and omeprazole.   TIME SPENT: Approximately 55 minutes spent on patient care.   ____________________________ Reuel Derby, MD ap:cms D: 08/09/2011 07:29:50 ET T: 08/09/2011 09:50:34 ET JOB#: [id number]  cc: Pearlean Brownie Dametri Ozburn, MD, <Dictator> Abbey Chatters. Jorene Guest, MD Reuel Derby MD ELECTRONICALLY SIGNED 08/28/2011 1:59

## 2014-09-03 NOTE — Consult Note (Signed)
Chief Complaint:   Subjective/Chief Complaint Feels better after blood transfusion. Hgb up today. No active bleeding.   VITAL SIGNS/ANCILLARY NOTES: **Vital Signs.:   25-May-13 09:42   Vital Signs Type Q 4hr   Temperature Temperature (F) 98.4   Celsius 36.8   Temperature Source oral   Pulse Pulse 59   Respirations Respirations 20   Systolic BP Systolic BP 152   Diastolic BP (mmHg) Diastolic BP (mmHg) 62   Mean BP 92   BP Source Dinamap   Pulse Ox % Pulse Ox % 98   Oxygen Delivery 2L   Brief Assessment:   Cardiac Regular    Respiratory rhonchi    Gastrointestinal Normal   Routine Hem:  25-May-13 05:23    Hemoglobin (CBC) 8.7   Hematocrit (CBC) 27.7  Blood Glucose:  25-May-13 07:28    POCT Blood Glucose 94   Assessment/Plan:  Assessment/Plan:   Assessment Chronic GI bleeding. No active bleeding.    Plan Prefer conservative treatment. Will follow. Thanks. Await hematology consult.   Electronic Signatures: Lutricia Feilh, Irvin Lizama (MD)  (Signed [id number]-May-13 11:09)  Authored: Chief Complaint, VITAL SIGNS/ANCILLARY NOTES, Brief Assessment, Lab Results, Assessment/Plan   Last Updated: 25-May-13 11:09 by Lutricia Feilh, Siddhanth Denk (MD)

## 2014-09-03 NOTE — Consult Note (Signed)
Chief Complaint:   Subjective/Chief Complaint No GI complaints today. SOB last night but better today. O2 sat 98% on 2L.. Hgb sl down from yest.   VITAL SIGNS/ANCILLARY NOTES: **Vital Signs.:   26-May-13 09:48   Vital Signs Type Q 4hr   Temperature Temperature (F) 98   Celsius 36.6   Temperature Source oral   Pulse Pulse 56   Pulse source per Dinamap   Respirations Respirations 22   Systolic BP Systolic BP 131   Diastolic BP (mmHg) Diastolic BP (mmHg) 62   Mean BP 85   BP Source Dinamap   Pulse Ox % Pulse Ox % 98   Pulse Ox Activity Level  At rest   Oxygen Delivery 2L   Brief Assessment:   Cardiac Regular    Respiratory rhonchi    Gastrointestinal Normal   Blood Glucose:  26-May-13 11:40    POCT Blood Glucose 149   Assessment/Plan:  Assessment/Plan:   Assessment Chronic GI bleeding with anemia. NO acute bleeding.    Plan Follow heme/onc recommendations. Prefer conservative treatment. Dr. Mechele CollinElliott will see patient tomorrow. Thanks.   Electronic Signatures: Lutricia Feilh, Tallula Grindle (MD)  (Signed [id number]-May-13 12:23)  Authored: Chief Complaint, VITAL SIGNS/ANCILLARY NOTES, Brief Assessment, Lab Results, Assessment/Plan   Last Updated: 26-May-13 12:23 by Lutricia Feilh, Janssen Zee (MD)
